# Patient Record
Sex: Male | Born: 1955 | Race: White | Hispanic: No | Smoking: Former smoker
Health system: Southern US, Community
[De-identification: ages and names within clinical notes are randomized; demographics above are authoritative.]

## PROBLEM LIST (undated history)

## (undated) DIAGNOSIS — Z9889 Other specified postprocedural states: Secondary | ICD-10-CM

## (undated) DIAGNOSIS — Z9289 Personal history of other medical treatment: Secondary | ICD-10-CM

## (undated) DIAGNOSIS — Z7901 Long term (current) use of anticoagulants: Secondary | ICD-10-CM

## (undated) DIAGNOSIS — R9430 Abnormal result of cardiovascular function study, unspecified: Secondary | ICD-10-CM

## (undated) DIAGNOSIS — I4891 Unspecified atrial fibrillation: Secondary | ICD-10-CM

## (undated) HISTORY — DX: Long term (current) use of anticoagulants: Z79.01

## (undated) HISTORY — DX: Other specified postprocedural states: Z98.890

## (undated) HISTORY — DX: Abnormal result of cardiovascular function study, unspecified: R94.30

## (undated) HISTORY — DX: Personal history of other medical treatment: Z92.89

## (undated) HISTORY — DX: Unspecified atrial fibrillation: I48.91

## (undated) HISTORY — PX: OTHER SURGICAL HISTORY: SHX169

---

## 2008-12-02 ENCOUNTER — Ambulatory Visit: Payer: Self-pay | Admitting: Gastroenterology

## 2009-01-27 ENCOUNTER — Encounter (INDEPENDENT_AMBULATORY_CARE_PROVIDER_SITE_OTHER): Payer: Self-pay | Admitting: Interventional Radiology

## 2009-01-27 ENCOUNTER — Ambulatory Visit (HOSPITAL_COMMUNITY): Admission: RE | Admit: 2009-01-27 | Discharge: 2009-01-27 | Payer: Self-pay | Admitting: Gastroenterology

## 2010-04-25 IMAGING — US US BIOPSY
1 series · 7 of 7 positions shown · non-contrast
Comparison: none

Clinical: Chronic hepatitis C

ULTRASOUND-GUIDED RANDOM CORE LIVER BIOPSY:
An ultrasound guided liver biopsy was thoroughly discussed with the
patient and questions were answered. The benefits, risks,
alternatives, and complications were also discussed. The patient
understands and wishes to proceed with the procedure. A verbal as
well as written consent was obtained.
Ultrasound imaging of the liver was performed and an appropriate
skin entry site was determined. Skin site was marked, prepped and
draped in the usual sterile fashion. 1% Lidocaine was infiltrated
locally. A 17 gauge Trocar needle was advanced under ultrasound
guidance into the liver. Imaging was obtained documenting
appropriate needle position.  4 coaxial 18 gauge core samples were
then obtained and sent to the laboratory for further analysis. Post
procedure scans demonstrate no evidence of bleeding or hematoma.
The patient tolerated the procedure well with no immediate
complication.
Cardiac and respiratory monitoring was provided by the radiology
RN.  Moderate sedation in the form of 75 mcg fentanyl and 1.5 mg of
versed were administered throughout the procedure for a total of 10
minutes.

[Series 1: us biopsy · 0.32mm/px · 7 of 7 slices shown]
[im 1/7]
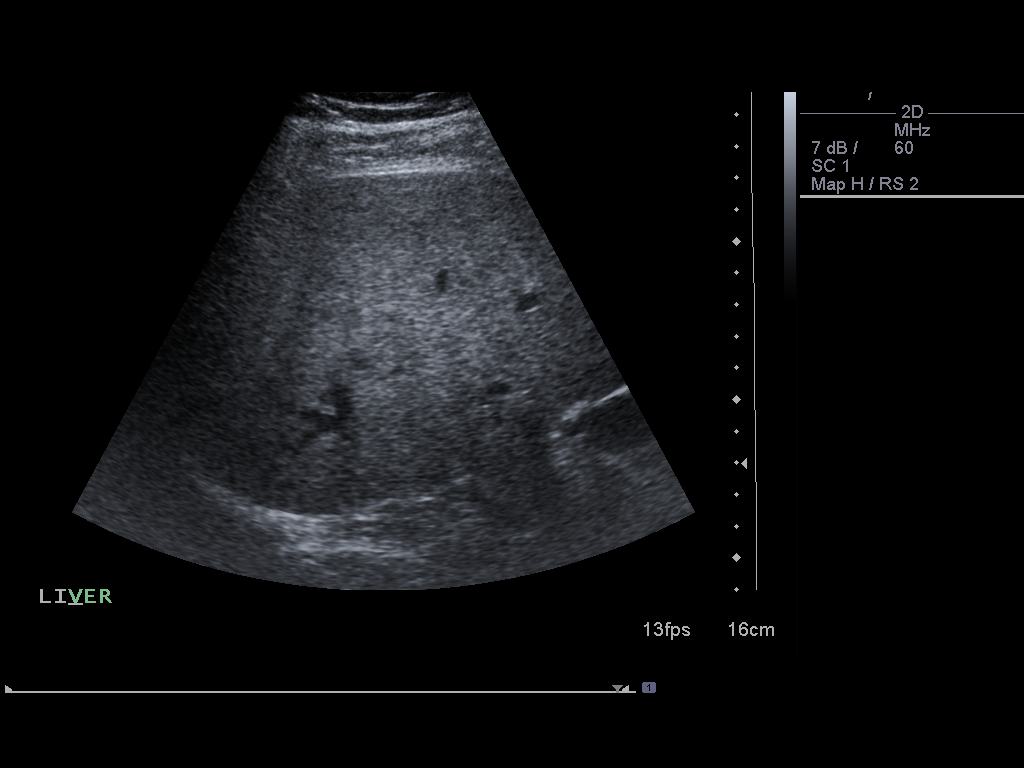
[im 2/7]
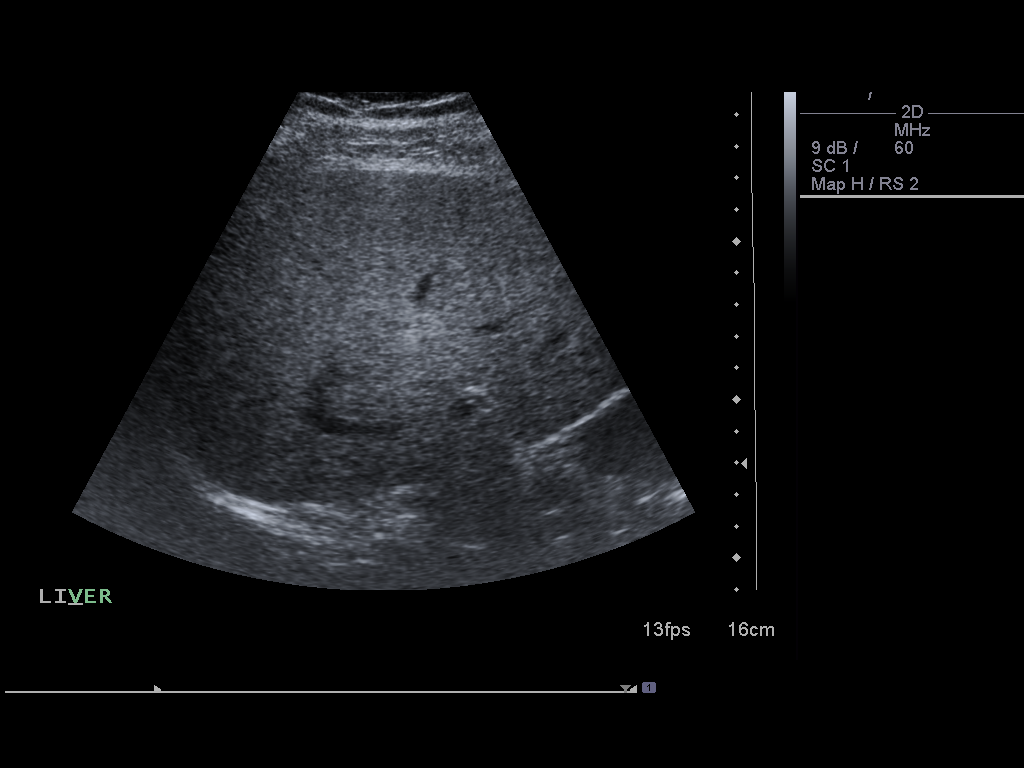
[im 3/7]
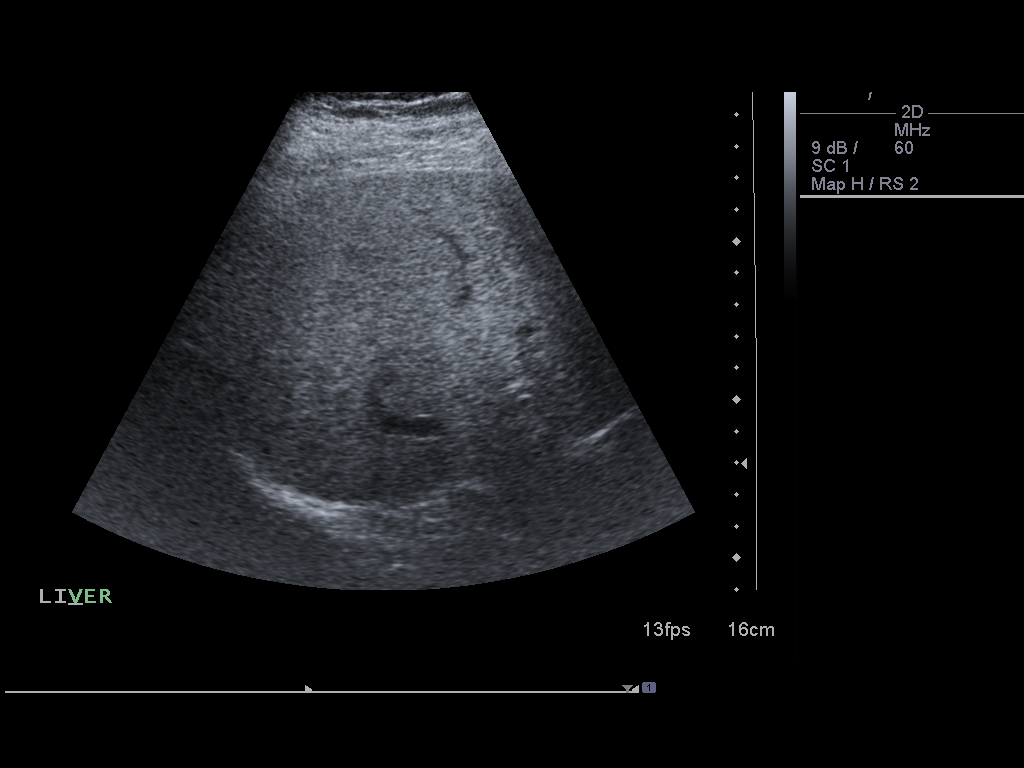
[im 4/7]
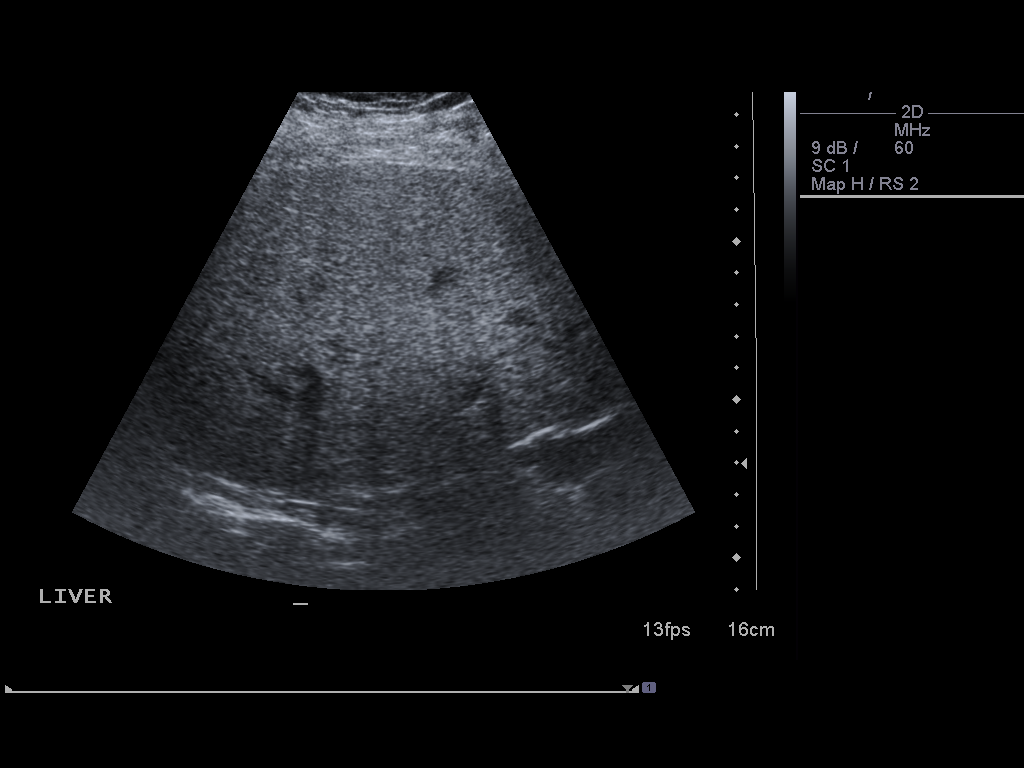
[im 5/7]
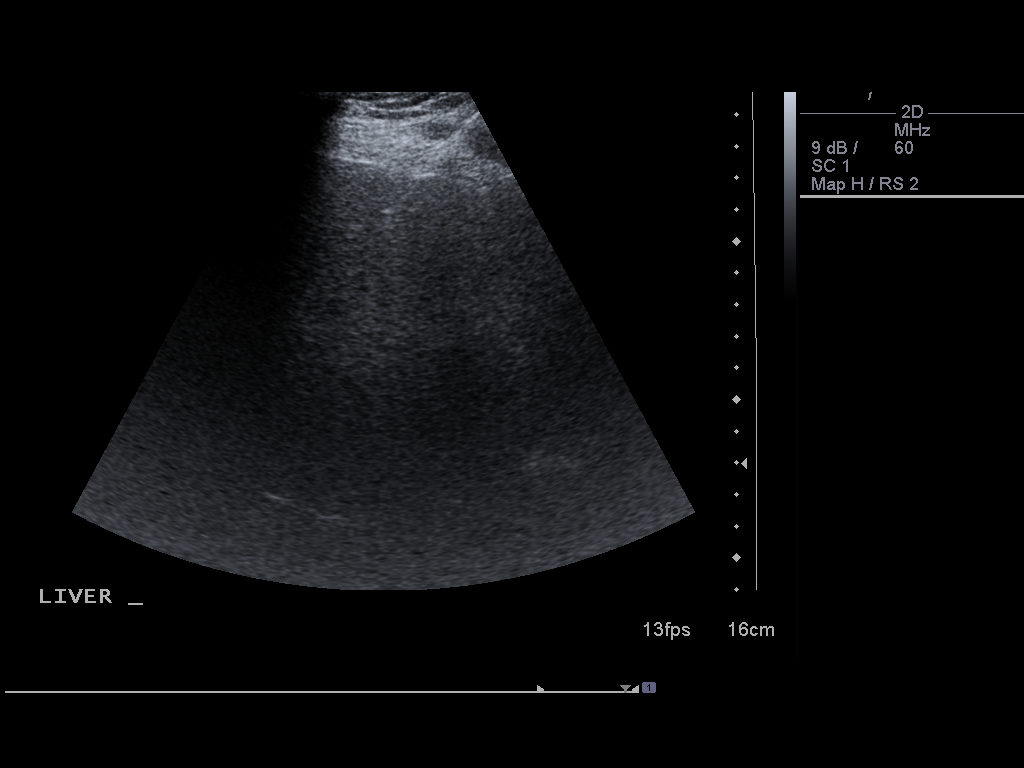
[im 6/7]
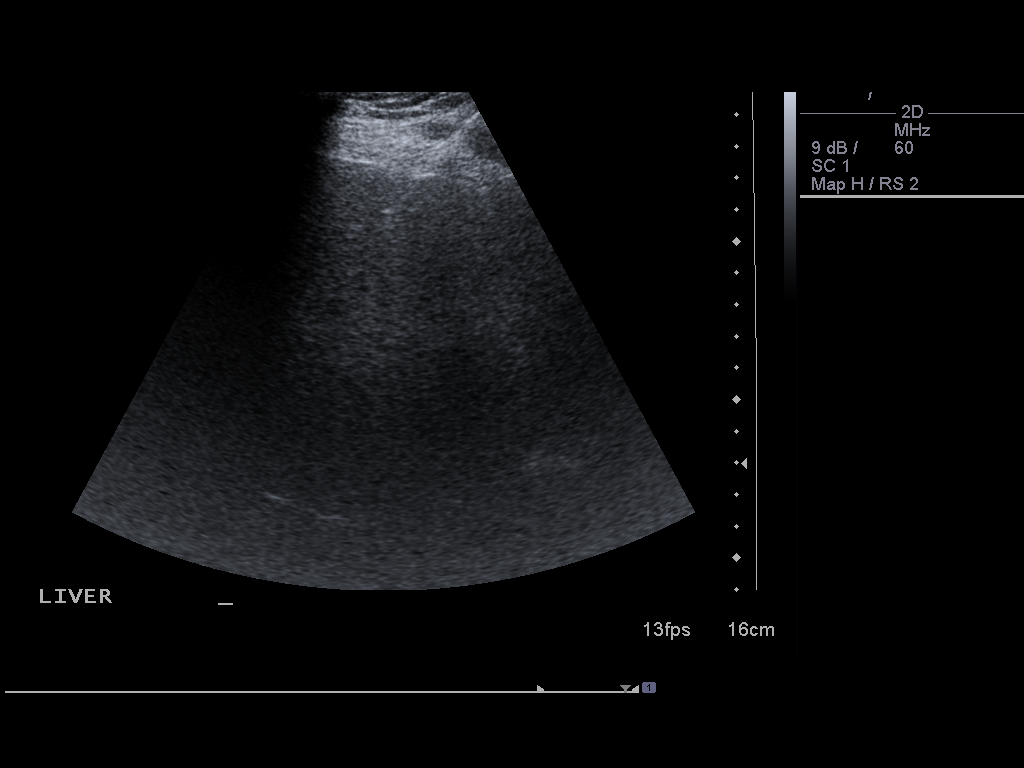
[im 7/7]
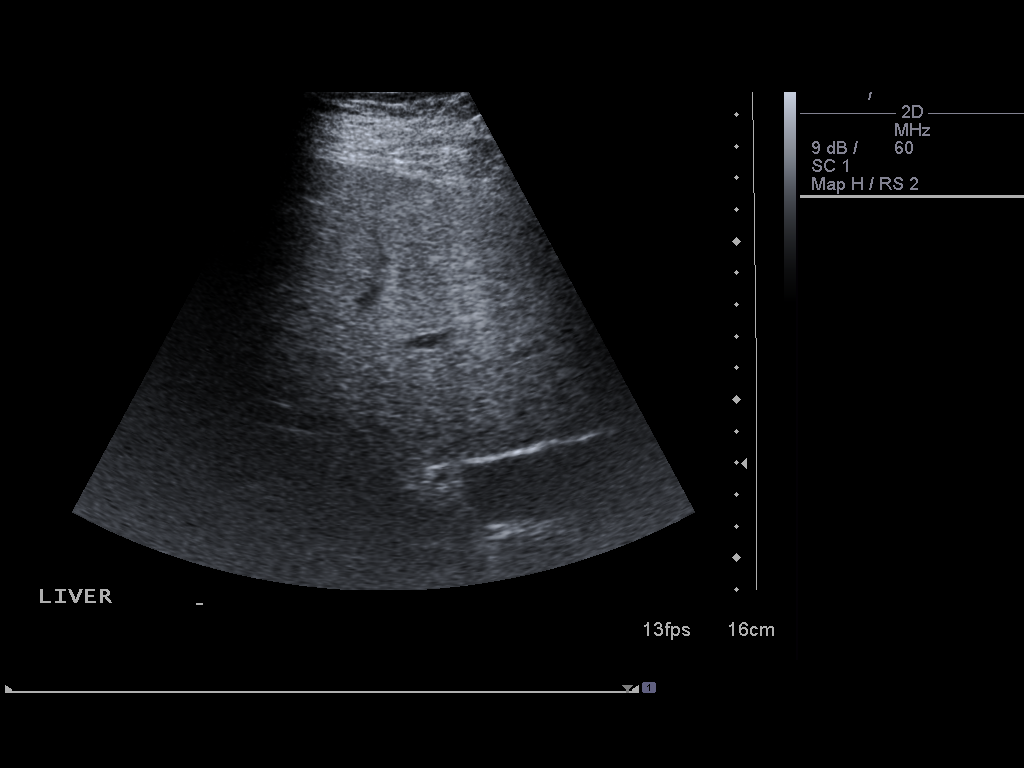

[7 of 7 positions shown; findings below may reference images not displayed]

IMPRESSION: 1. Technically successful ultrasound guided random liver core
biopsy with moderate sedation as described above.

Read by: Chagolla, Farzana.-CHATHA

## 2011-01-17 LAB — CBC
HCT: 47.6 % (ref 39.0–52.0)
Hemoglobin: 16.7 g/dL (ref 13.0–17.0)
RBC: 5 MIL/uL (ref 4.22–5.81)
WBC: 7.7 10*3/uL (ref 4.0–10.5)

## 2018-10-22 DIAGNOSIS — I161 Hypertensive emergency: Secondary | ICD-10-CM | POA: Insufficient documentation

## 2018-10-26 DIAGNOSIS — I5043 Acute on chronic combined systolic (congestive) and diastolic (congestive) heart failure: Secondary | ICD-10-CM

## 2018-10-26 DIAGNOSIS — I5022 Chronic systolic (congestive) heart failure: Secondary | ICD-10-CM

## 2018-10-26 HISTORY — DX: Acute on chronic combined systolic (congestive) and diastolic (congestive) heart failure: I50.43

## 2018-10-28 DIAGNOSIS — I10 Essential (primary) hypertension: Secondary | ICD-10-CM

## 2018-10-28 HISTORY — DX: Essential (primary) hypertension: I10

## 2018-12-30 DIAGNOSIS — I4892 Unspecified atrial flutter: Secondary | ICD-10-CM

## 2018-12-30 DIAGNOSIS — I4891 Unspecified atrial fibrillation: Secondary | ICD-10-CM

## 2018-12-30 DIAGNOSIS — I5022 Chronic systolic (congestive) heart failure: Secondary | ICD-10-CM

## 2018-12-30 DIAGNOSIS — I5042 Chronic combined systolic (congestive) and diastolic (congestive) heart failure: Secondary | ICD-10-CM

## 2018-12-30 DIAGNOSIS — Z8739 Personal history of other diseases of the musculoskeletal system and connective tissue: Secondary | ICD-10-CM

## 2018-12-30 DIAGNOSIS — I10 Essential (primary) hypertension: Secondary | ICD-10-CM

## 2018-12-31 DIAGNOSIS — I351 Nonrheumatic aortic (valve) insufficiency: Secondary | ICD-10-CM

## 2018-12-31 DIAGNOSIS — I340 Nonrheumatic mitral (valve) insufficiency: Secondary | ICD-10-CM

## 2019-01-05 ENCOUNTER — Telehealth: Payer: Self-pay | Admitting: Emergency Medicine

## 2019-01-05 DIAGNOSIS — I4891 Unspecified atrial fibrillation: Secondary | ICD-10-CM

## 2019-01-05 DIAGNOSIS — I5043 Acute on chronic combined systolic (congestive) and diastolic (congestive) heart failure: Secondary | ICD-10-CM | POA: Insufficient documentation

## 2019-01-05 HISTORY — DX: Unspecified atrial fibrillation: I48.91

## 2019-01-05 NOTE — Telephone Encounter (Signed)
    _____________   COVID-19 Pre-Screening Questions:  . Do you currently have a fever? No  (yes = cancel and refer to pcp for e-visit) . Have you recently travelled on a cruise, internationally, or to [place], [place], [place], [place], [place], or [place], [place] [employer]) ? No (yes = cancel, stay home, monitor symptoms, and contact pcp or initiate e-visit if symptoms develop) . Have you been in contact with someone that is currently pending confirmation of Covid19 testing or has been confirmed to have the Covid19 virus?  no (yes = cancel, stay home, away from tested individual, monitor symptoms, and contact pcp or initiate e-visit if symptoms develop) . Are you currently experiencing fatigue or cough? NO  (yes = pt should be prepared to have a mask placed at the time of their visit).    Confirmed appointment for patient tomorrow at 11 am with Dr. Bing Matter.

## 2019-01-06 ENCOUNTER — Telehealth (INDEPENDENT_AMBULATORY_CARE_PROVIDER_SITE_OTHER): Payer: Self-pay | Admitting: Cardiology

## 2019-01-06 ENCOUNTER — Encounter: Payer: Self-pay | Admitting: Cardiology

## 2019-01-06 ENCOUNTER — Telehealth: Payer: Self-pay

## 2019-01-06 ENCOUNTER — Other Ambulatory Visit: Payer: Self-pay

## 2019-01-06 VITALS — BP 129/100 | HR 92 | Ht 70.0 in | Wt 221.0 lb

## 2019-01-06 DIAGNOSIS — I4892 Unspecified atrial flutter: Secondary | ICD-10-CM

## 2019-01-06 DIAGNOSIS — I5043 Acute on chronic combined systolic (congestive) and diastolic (congestive) heart failure: Secondary | ICD-10-CM

## 2019-01-06 DIAGNOSIS — I110 Hypertensive heart disease with heart failure: Secondary | ICD-10-CM

## 2019-01-06 DIAGNOSIS — I10 Essential (primary) hypertension: Secondary | ICD-10-CM

## 2019-01-06 HISTORY — DX: Unspecified atrial flutter: I48.92

## 2019-01-06 NOTE — Telephone Encounter (Signed)
Left voicemail to have patient confirm if he is okay with changing his office visit to a web visit or telephone visit.

## 2019-01-06 NOTE — Addendum Note (Signed)
Addended by: Vanessa Pennington R on: 01/06/2019 12:12 PM   Modules accepted: Orders

## 2019-01-06 NOTE — Progress Notes (Signed)
Virtual Visit via Video Note    Evaluation Performed:  Follow-up visit  This visit type was conducted due to national recommendations for restrictions regarding the COVID-19 Pandemic (e.g. social distancing).  This format is felt to be most appropriate for this patient at this time.  All issues noted in this document were discussed and addressed.  No physical exam was performed (except for noted visual exam findings with Video Visits).  Please refer to the patient's chart (MyChart message for video visits and phone note for telephone visits) for the patient's consent to telehealth for [facility].  Date:  01/06/2019  ID: David Braun, DOB 08-Oct-1956, MRN [id number]   Patient Location:  [address] [place] [address]   Provider location:   [facility] [place] [facility]  PCP:  Eloisa Northern, MD  Cardiologist:  Gypsy Balsam, MD     Chief Complaint: I am being recently in the hospital  History of Present Illness:    David Braun is a 63 y.o. male  who presents via audio/video conferencing for a telehealth visit today.  Recently he went for annual physical to his primary care physician he was noted to be tachycardic he was sent to the emergency room he was identified to be Nitrol flutter with fast ventricular rate.  Echocardiogram was also done which showed ejection fraction 35 to 40%.  He was put on Cardizem as well as beta-blocker and then spontaneously converted to sinus rhythm.  He was also anticoagulated which is very appropriate.  His chads 2 Vascor equals 2 for hypertension as well as for congestive heart failure.  We do tele-visit today after being discharged from the hospital.  Overall he said he feels fine he never felt bad even when he got atrial flutter still described to have some rare skipped beats but no sustained arrhythmia.  He does check blood pressure on the regular basis blood pressure seems to be under control however he noted that sometimes his heart rate is  elevated.  He does not feel it.  He try to get Eliquis in the pharmacy however price was excessive he was not able to afford it.  Luckily we gave him a coupon for 1 month of Eliquis and will work on getting him enrolled to our program so we will be able to help him that way.  Overall denies having any chest pain tightness squeezing pressure burning the chest doing well. He does have past medical history significant for hypertension In January he ended up in our hospital because of shortness of breath he was identified to have cardiomyopathy at that time.  Visit in the emergency room because of high blood pressure previously.   The patient does not symptoms concerning for COVID-19 infection (fever, chills, cough, or new SHORTNESS OF BREATH).    Prior CV studies:   The following studies were reviewed today:  I reviewed old study from the hospital.  His ejection fraction echocardiogram was 35 to 40%.  Chest x-ray was unrevealing.     No past medical history on file.     Current Meds  Medication Sig  . apixaban (ELIQUIS) 5 MG TABS tablet Take 5 mg by mouth 2 (two) times daily.  Marland Kitchen aspirin EC 81 MG tablet Take 1 tablet by mouth daily.  Marland Kitchen CARTIA XT 120 MG 24 hr capsule Take 120 mg by mouth daily.  . carvedilol (COREG) 25 MG tablet Take 25 mg by mouth 2 (two) times daily.  . furosemide (LASIX)  20 MG tablet Take 20 mg by mouth daily.      Family History: The patient's family history includes Diabetes in his mother; Hypertension in his father and mother.   ROS:   Please see the history of present illness.     All other systems reviewed and are negative.   Labs/Other Tests and Data Reviewed:     Recent Labs: No results found for requested labs within last 8760 hours.  Recent Lipid Panel No results found for: CHOL, TRIG, HDL, CHOLHDL, VLDL, LDLCALC, LDLDIRECT    Exam:    Vital Signs:  Wt 141 lb (64 kg)   BMI 24.20 kg/m    Well nourished, well developed male in no acute  distress. We conducted this visit through the video link with WebEx.  He is alert awake oriented x3 not in any distress there is no JVD there is no swelling of lactamases his mood was appropriate.  Diagnosis for this visit:   1. CHF (congestive heart failure), NYHA class III, acute on chronic, combined (HCC)   2. Paroxysmal atrial flutter (HCC)   3. Uncontrolled hypertension      ASSESSMENT & PLAN:    1.  Congestive heart failure with ejection fraction of 35 to 40% he appears to be compensated he is on beta-blocker carvedilol 25 twice daily with advised him to continue.  For some reason he is not on lisinopril.  I will bring him to the office next week we will do Chem-7 as well as EKG and based on that decide about future therapy.  I hope to be able to put him on ACE inhibitor.  EKG also help me to determine what rhythm he is in we may be forced to discontinue his calcium channel blocker. 2.  Paroxysmal atrial flutter: We will continue with anticoagulation.  No antiarrhythmic yet.  We will see how his rhythm will behave. 3.  Uncontrolled hypertension.  His blood pressure is better controlled.  To present management.  Review records from the hospital for that visit  COVID-19 Education: The signs and symptoms of COVID-19 were discussed with the patient and how to seek care for testing (follow up with PCP or arrange E-visit).  The importance of social distancing was discussed today.  Patient Risk:   After full review of this patients clinical status, I feel that they are at least moderate risk at this time.  Time:   Today, I have spent 25 minutes with the patient with telehealth technology discussing pt health issues.  We finished visit at 11:25 AM    Medication Adjustments/Labs and Tests Ordered: Current medicines are reviewed at length with the patient today.  Concerns regarding medicines are outlined above.  No orders of the defined types were placed in this encounter.  Medication  changes: No orders of the defined types were placed in this encounter.    Disposition: He is supposed to come to our office on Monday to have EKG done as well as Chem-7.  Enroll him in our program for Eliquis  Signed, Georgeanna Lea, MD, [facility] 01/06/2019 11:26 AM    [facility]

## 2019-01-06 NOTE — Patient Instructions (Signed)
Medication Instructions:  .isntcur  If you need a refill on your cardiac medications before your next appointment, please call your pharmacy.   Lab work: Your physician recommends that you return for lab work next week: bmp   If you have labs (blood work) drawn today and your tests are completely normal, you will receive your results only by: Marland Kitchen MyChart Message (if you have MyChart) OR . A paper copy in the mail If you have any lab test that is abnormal or we need to change your treatment, we will call you to review the results.  Testing/Procedures: None.  Follow-Up: At [facility], you and your health needs are our priority.  As part of our continuing mission to provide you with exceptional heart care, we have created designated Provider Care Teams.  These Care Teams include your primary Cardiologist (physician) and Advanced Practice Providers (APPs -  Physician Assistants and Nurse Practitioners) who all work together to provide you with the care you need, when you need it. You will need a follow up appointment in 1 weeks.  Please call our office 2 months in advance to schedule this appointment.  You may see No primary care provider on file. or another member of our [employer] Provider Team in [place]: David Herrlich, MD . David Crome, MD  Any Other Special Instructions Will Be Listed Below (If Applicable).

## 2019-01-12 ENCOUNTER — Ambulatory Visit (INDEPENDENT_AMBULATORY_CARE_PROVIDER_SITE_OTHER): Payer: Self-pay | Admitting: Cardiology

## 2019-01-12 ENCOUNTER — Encounter: Payer: Self-pay | Admitting: Cardiology

## 2019-01-12 ENCOUNTER — Telehealth: Payer: Self-pay | Admitting: Emergency Medicine

## 2019-01-12 ENCOUNTER — Other Ambulatory Visit: Payer: Self-pay

## 2019-01-12 VITALS — BP 128/64 | HR 140 | Ht 70.0 in | Wt 232.2 lb

## 2019-01-12 DIAGNOSIS — I429 Cardiomyopathy, unspecified: Secondary | ICD-10-CM

## 2019-01-12 DIAGNOSIS — I4892 Unspecified atrial flutter: Secondary | ICD-10-CM

## 2019-01-12 DIAGNOSIS — I5043 Acute on chronic combined systolic (congestive) and diastolic (congestive) heart failure: Secondary | ICD-10-CM

## 2019-01-12 DIAGNOSIS — I10 Essential (primary) hypertension: Secondary | ICD-10-CM

## 2019-01-12 HISTORY — DX: Cardiomyopathy, unspecified: I42.9

## 2019-01-12 LAB — BASIC METABOLIC PANEL
BUN/Creatinine Ratio: 13 (ref 10–24)
BUN: 17 mg/dL (ref 8–27)
CALCIUM: 9.3 mg/dL (ref 8.6–10.2)
CHLORIDE: 101 mmol/L (ref 96–106)
CO2: 22 mmol/L (ref 20–29)
CREATININE: 1.3 mg/dL — AB (ref 0.76–1.27)
GFR calc non Af Amer: 59 mL/min/{1.73_m2} — ABNORMAL LOW (ref >59)
GFR, EST AFRICAN AMERICAN: 68 mL/min/{1.73_m2} (ref >59)
Glucose: 126 mg/dL — ABNORMAL HIGH (ref 65–99)
Potassium: 4.3 mmol/L (ref 3.5–5.2)
Sodium: 142 mmol/L (ref 134–144)

## 2019-01-12 MED ORDER — AMIODARONE HCL 200 MG PO TABS: 200.0000 mg | ORAL_TABLET | Freq: Every day | ORAL | 1 refills | Status: DC

## 2019-01-12 NOTE — Patient Instructions (Signed)
Medication Instructions:  Your physician has recommended you make the following change in your medication:  STOP: Cartia   START: Amiodarone 200 mg daily  If you need a refill on your cardiac medications before your next appointment, please call your pharmacy.   Lab work: Your physician recommends that you return for lab work today: bmp   If you have labs (blood work) drawn today and your tests are completely normal, you will receive your results only by: David Braun MyChart Message (if you have MyChart) OR . A paper copy in the mail If you have any lab test that is abnormal or we need to change your treatment, we will call you to review the results.  Testing/Procedures: None.   Follow-Up: At [facility], you and your health needs are our priority.  As part of our continuing mission to provide you with exceptional heart care, we have created designated Provider Care Teams.  These Care Teams include your primary Cardiologist (physician) and Advanced Practice Providers (APPs -  Physician Assistants and Nurse Practitioners) who all work together to provide you with the care you need, when you need it. You will need a follow up appointment in 2 weeks.  Please call our office 2 months in advance to schedule this appointment.  You may see No primary care provider on file.  or another member of our [employer] Provider Team in [place]: Norman Herrlich, MD . Belva Crome, MD  Any Other Special Instructions Will Be Listed Below (If Applicable).  Amiodarone tablets What is this medicine? AMIODARONE (a MEE oh da rone) is an antiarrhythmic drug. It helps make your heart beat regularly. Because of the side effects caused by this medicine, it is only used when other medicines have not worked. It is usually used for heartbeat problems that may be life threatening. This medicine may be used for other purposes; ask your health care provider or pharmacist if you have questions. COMMON BRAND NAME(S):  Cordarone, Pacerone What should I tell my health care provider before I take this medicine? They need to know if you have any of these conditions: -liver disease -lung disease -other heart problems -thyroid disease -an unusual or allergic reaction to amiodarone, iodine, other medicines, foods, dyes, or preservatives -pregnant or trying to get pregnant -breast-feeding How should I use this medicine? Take this medicine by mouth with a glass of water. Follow the directions on the prescription label. You can take this medicine with or without food. However, you should always take it the same way each time. Take your doses at regular intervals. Do not take your medicine more often than directed. Do not stop taking except on the advice of your doctor or health care professional. A special MedGuide will be given to you by the pharmacist with each prescription and refill. Be sure to read this information carefully each time. Talk to your pediatrician regarding the use of this medicine in children. Special care may be needed. Overdosage: If you think you have taken too much of this medicine contact a poison control center or emergency room at once. NOTE: This medicine is only for you. Do not share this medicine with others. What if I miss a dose? If you miss a dose, take it as soon as you can. If it is almost time for your next dose, take only that dose. Do not take double or extra doses. What may interact with this medicine? Do not take this medicine with any of the following medications: -abarelix -apomorphine -  arsenic trioxide -certain antibiotics like erythromycin, gemifloxacin, levofloxacin, pentamidine -certain medicines for depression like amoxapine, tricyclic antidepressants -certain medicines for fungal infections like fluconazole, itraconazole, ketoconazole, posaconazole, voriconazole -certain medicines for irregular heart beat like disopyramide, dofetilide, dronedarone, ibutilide,  propafenone, sotalol -certain medicines for malaria like chloroquine, halofantrine -cisapride -droperidol -haloperidol -hawthorn -maprotiline -methadone -phenothiazines like chlorpromazine, mesoridazine, thioridazine -pimozide -ranolazine -red yeast rice -vardenafil -ziprasidone This medicine may also interact with the following medications: -antiviral medicines for HIV or AIDS -certain medicines for blood pressure, heart disease, irregular heart beat -certain medicines for cholesterol like atorvastatin, cerivastatin, lovastatin, simvastatin -certain medicines for hepatitis C like sofosbuvir and ledipasvir; sofosbuvir -certain medicines for seizures like phenytoin -certain medicines for thyroid problems -certain medicines that treat or prevent blood clots like warfarin -cholestyramine -cimetidine -clopidogrel -cyclosporine -dextromethorphan -diuretics -fentanyl -general anesthetics -grapefruit juice -lidocaine -loratadine -methotrexate -other medicines that prolong the QT interval (cause an abnormal heart rhythm) -procainamide -quinidine -rifabutin, rifampin, or rifapentine -St. John's Wort -trazodone This list may not describe all possible interactions. Give your health care provider a list of all the medicines, herbs, non-prescription drugs, or dietary supplements you use. Also tell them if you smoke, drink alcohol, or use illegal drugs. Some items may interact with your medicine. What should I watch for while using this medicine? Your condition will be monitored closely when you first begin therapy. Often, this drug is first started in a hospital or other monitored health care setting. Once you are on maintenance therapy, visit your doctor or health care professional for regular checks on your progress. Because your condition and use of this medicine carry some risk, it is a good idea to carry an identification card, necklace or bracelet with details of your condition,  medications, and doctor or health care professional. Bonita Quin may get drowsy or dizzy. Do not drive, use machinery, or do anything that needs mental alertness until you know how this medicine affects you. Do not stand or sit up quickly, especially if you are an older patient. This reduces the risk of dizzy or fainting spells. This medicine can make you more sensitive to the sun. Keep out of the sun. If you cannot avoid being in the sun, wear protective clothing and use sunscreen. Do not use sun lamps or tanning beds/booths. You should have regular eye exams before and during treatment. Call your doctor if you have blurred vision, see halos, or your eyes become sensitive to light. Your eyes may get dry. It may be helpful to use a lubricating eye solution or artificial tears solution. If you are going to have surgery or a procedure that requires contrast dyes, tell your doctor or health care professional that you are taking this medicine. What side effects may I notice from receiving this medicine? Side effects that you should report to your doctor or health care professional as soon as possible: -allergic reactions like skin rash, itching or hives, swelling of the face, lips, or tongue -blue-gray coloring of the skin -blurred vision, seeing blue green halos, increased sensitivity of the eyes to light -breathing problems -chest pain -dark urine -fast, irregular heartbeat -feeling faint or light-headed -intolerance to heat or cold -nausea or vomiting -pain and swelling of the scrotum -pain, tingling, numbness in feet, hands -redness, blistering, peeling or loosening of the skin, including inside the mouth -spitting up blood -stomach pain -sweating -unusual or uncontrolled movements of body -unusually weak or tired -weight gain or loss -yellowing of the eyes or skin Side effects that usually do  not require medical attention (report to your doctor or health care professional if they continue or are  bothersome): -change in sex drive or performance -constipation -dizziness -headache -loss of appetite -trouble sleeping This list may not describe all possible side effects. Call your doctor for medical advice about side effects. You may report side effects to FDA at 1-800-FDA-1088. Where should I keep my medicine? Keep out of the reach of children. Store at room temperature between 20 and 25 degrees C (68 and 77 degrees F). Protect from light. Keep container tightly closed. Throw away any unused medicine after the expiration date. NOTE: This sheet is a summary. It may not cover all possible information. If you have questions about this medicine, talk to your doctor, pharmacist, or health care provider.  2019 Elsevier/Gold Standard (2013-12-28 19:48:11)

## 2019-01-12 NOTE — Progress Notes (Signed)
Cardiology Office Note:    Date:  01/12/2019   ID:  [place], DOB 10-10-1955, MRN [id number]  PCP:  Eloisa Northern, MD  Cardiologist:  Gypsy Balsam, MD    Referring MD: Eloisa Northern, MD   No chief complaint on file. I have diabetes  History of Present Illness:    [place] is a 63 y.o. male who was recently in the round of hospital.  He was find to be in atrial flutter with fast ventricular rate echocardiogram was done which showed diminished left ventricular ejection fraction.  Anticoagulation has been initiated and then he converted spontaneously to sinus rhythm.  Since that time he described palpitations on and off for very he came to our office to have EKG done and showed an have he is in atrial flutter with 2-1 AV conduction with ventricular response of 140.  He is asymptomatic other than feeling palpitations.  He does not have shortness of breath he does not have chest pain he is not dizzy.  I told him that in this situation hospitalization may be required however he does not want to go back to hospital.  He said he is doing well.  Therefore, I will stop his Cardizem.  I will put him on amiodarone 200 mg twice daily.  We will be in touch with him over the phone on a daily basis to see how he does.  I also warned him if he developed shortness of breath chest tightness dizziness or simply does not feel well he needs to go to the emergency room.  He is anticoagulated and I will continue.  We initiated also conversation about potentially doing atrial flutter ablation which may be the best option for him.  Because of coronavirus situation I preferred conservative approach initially but if we fail with amiodarone atrial flutter ablation may be desirable.  No past medical history on file.    Current Medications: Current Meds  Medication Sig  . allopurinol (ZYLOPRIM) 300 MG tablet Take 1 tablet by mouth daily.  Marland Kitchen apixaban (ELIQUIS) 5 MG TABS tablet Take 5 mg by mouth 2 (two) times daily.   Marland Kitchen aspirin EC 81 MG tablet Take 1 tablet by mouth daily.  Marland Kitchen CARTIA XT 120 MG 24 hr capsule Take 120 mg by mouth daily.  . carvedilol (COREG) 25 MG tablet Take 25 mg by mouth 2 (two) times daily.  . furosemide (LASIX) 20 MG tablet Take 20 mg by mouth daily.     Allergies:   Patient has no known allergies.   Social History   Socioeconomic History  . Marital status: Single    Spouse name: Not on file  . Number of children: Not on file  . Years of education: Not on file  . Highest education level: Not on file  Occupational History  . Not on file  Social Needs  . Financial resource strain: Not on file  . Food insecurity:    Worry: Not on file    Inability: Not on file  . Transportation needs:    Medical: Not on file    Non-medical: Not on file  Tobacco Use  . Smoking status: Former Games developer  . Smokeless tobacco: Never Used  Substance and Sexual Activity  . Alcohol use: Yes  . Drug use: Not Currently    Types: Marijuana  . Sexual activity: Not on file  Lifestyle  . Physical activity:    Days per week: Not on file    Minutes per session: Not on  file  . Stress: Not on file  Relationships  . Social connections:    Talks on phone: Not on file    Gets together: Not on file    Attends religious service: Not on file    Active member of club or organization: Not on file    Attends meetings of clubs or organizations: Not on file    Relationship status: Not on file  Other Topics Concern  . Not on file  Social History Narrative  . Not on file     Family History: The patient's family history includes Diabetes in his mother; Hypertension in his father and mother. ROS:   Please see the history of present illness.    All 14 point review of systems negative except as described per history of present illness  EKGs/Labs/Other Studies Reviewed:    EKG done today show atrial flutter with 2-1 AV conduction.  Normal QS complex duration morphology  Recent Labs: No results found for  requested labs within last 8760 hours.  Recent Lipid Panel No results found for: CHOL, TRIG, HDL, CHOLHDL, VLDL, LDLCALC, LDLDIRECT  Physical Exam:    VS:  BP 128/64   Pulse (!) 140   Ht 5\' 10"  (1.778 m)   Wt 232 lb 3.2 oz (105.3 kg)   SpO2 98%   BMI 33.32 kg/m     Wt Readings from Last 3 Encounters:  01/12/19 232 lb 3.2 oz (105.3 kg)  01/06/19 221 lb (100.2 kg)     GEN:  Well nourished, well developed in no acute distress HEENT: Normal NECK: No JVD; No carotid bruits LYMPHATICS: No lymphadenopathy CARDIAC: RRR, tachycardic., no murmurs, no rubs, no gallops RESPIRATORY:  Clear to auscultation without rales, wheezing or rhonchi  ABDOMEN: Soft, non-tender, non-distended MUSCULOSKELETAL:  No edema; No deformity  SKIN: Warm and dry LOWER EXTREMITIES: no swelling NEUROLOGIC:  Alert and oriented x 3 PSYCHIATRIC:  Normal affect   ASSESSMENT:    1. Paroxysmal atrial flutter (HCC)   2. CHF (congestive heart failure), NYHA class III, acute on chronic, combined (HCC)   3. Essential hypertension    PLAN:    In order of problems listed above:  1. Paroxysmal atrial flutter with poorly controlled ventricular rate.  Again I offered him hospitalization he prefers not to do it we will try to manage this as an outpatient.  Likely he is asymptomatic except for palpitations.  I will start amiodarone 200 mg twice daily.  In the future he may require ablation.  We will continue anticoagulation. 2. Congestive heart failure.  Appears to be compensated no JVD no swelling of lower extremities no proximal nocturnal dyspnea.  We will continue present management.  However will be in touch over the phone every day to make sure he is doing well. 3. Essential hypertension blood pressure well controlled continue present management. 4. Cardiomyopathy with ejection fraction 35 to 40% he is already on high dose of beta-blocker which I will continue.  Amiodarone will be added to control better his atrial  flutter, I will do Chem-7 today if Chem-7 is acceptable will initiate lisinopril or ARB.   Medication Adjustments/Labs and Tests Ordered: Current medicines are reviewed at length with the patient today.  Concerns regarding medicines are outlined above.  No orders of the defined types were placed in this encounter.  Medication changes: No orders of the defined types were placed in this encounter.   Signed, Georgeanna Lea, MD, [facility] 01/12/2019 9:04 AM    [facility]

## 2019-01-12 NOTE — Telephone Encounter (Signed)
Patient denies any covid 19 symptoms and will be coming to office as planned.

## 2019-01-12 NOTE — Telephone Encounter (Signed)
   Primary Cardiologist: No primary care provider on file.   Pt contacted.  History and symptoms reviewed.  Pt will f/u with [facility] provider as scheduled.  Pt. advised that we are restricting visitors at this time and request that only patients present for check-in prior to their appointment.  All other visitors should remain in their car.  If necessary, only one visitor may come with the patient, into the building. For everyone's safety, all patients and visitor entering our practice area should expect to be screened again prior to entering our waiting area.  Lita Mains, RN  01/12/2019 8:23 AM

## 2019-01-19 ENCOUNTER — Telehealth: Payer: Self-pay | Admitting: Emergency Medicine

## 2019-01-19 NOTE — Telephone Encounter (Signed)
Patient scheduled for a televisit on Friday 01/23/2019 per Dr. Bing Matter. Consent sent to my chart.

## 2019-01-20 ENCOUNTER — Telehealth: Payer: Self-pay | Admitting: Cardiology

## 2019-01-20 MED ORDER — CARVEDILOL 25 MG PO TABS: 25.0000 mg | ORAL_TABLET | Freq: Two times a day (BID) | ORAL | 1 refills | Status: DC

## 2019-01-20 MED ORDER — AMIODARONE HCL 200 MG PO TABS: 200.0000 mg | ORAL_TABLET | Freq: Every day | ORAL | 1 refills | Status: DC

## 2019-01-20 NOTE — Telephone Encounter (Signed)
°*  STAT* PATIENT IS COMPLETELY OUT  1. Which medications need to be refilled? (please list name of each medication and dose if known) amiodarone (PACERONE) 200 MG    2. Which pharmacy/location (including street and city if local pharmacy) is medication to be sent to?  [facility] - [place], [place] - [address] [phone] (Phone) [phone] (Fax)     3. Do they need a 30 day or 90 day supply? 90

## 2019-01-20 NOTE — Telephone Encounter (Signed)
°  Patient is out of medication and needs this called in today!   1. Which medications need to be refilled? (please list name of each medication and dose if known) Carvedilol 25mg  twice a day  2. Which pharmacy/location (including street and city if local pharmacy) is medication to be sent to? [facility] on [address] in [place]  3. Do they need a 30 day or 90 day supply? 90

## 2019-01-20 NOTE — Telephone Encounter (Signed)
Coreg refill sent to [facility] on [address] In [place] per pt preference

## 2019-01-23 ENCOUNTER — Other Ambulatory Visit: Payer: Self-pay

## 2019-01-23 ENCOUNTER — Telehealth (INDEPENDENT_AMBULATORY_CARE_PROVIDER_SITE_OTHER): Payer: Self-pay | Admitting: Cardiology

## 2019-01-23 ENCOUNTER — Encounter: Payer: Self-pay | Admitting: Cardiology

## 2019-01-23 VITALS — BP 132/100 | HR 100 | Wt 231.0 lb

## 2019-01-23 DIAGNOSIS — I4892 Unspecified atrial flutter: Secondary | ICD-10-CM

## 2019-01-23 DIAGNOSIS — I10 Essential (primary) hypertension: Secondary | ICD-10-CM

## 2019-01-23 DIAGNOSIS — I5043 Acute on chronic combined systolic (congestive) and diastolic (congestive) heart failure: Secondary | ICD-10-CM

## 2019-01-23 DIAGNOSIS — I420 Dilated cardiomyopathy: Secondary | ICD-10-CM

## 2019-01-23 MED ORDER — AMIODARONE HCL 200 MG PO TABS: 200.0000 mg | ORAL_TABLET | Freq: Three times a day (TID) | ORAL | 1 refills | Status: DC

## 2019-01-23 NOTE — Patient Instructions (Signed)
Medication Instructions:  Your physician has recommended you make the following change in your medication:   Increase: Amiodarone to 200 mg 3 times daily. If you need a refill on your cardiac medications before your next appointment, please call your pharmacy.   Lab work: None.  If you have labs (blood work) drawn today and your tests are completely normal, you will receive your results only by: Marland Kitchen MyChart Message (if you have MyChart) OR . A paper copy in the mail If you have any lab test that is abnormal or we need to change your treatment, we will call you to review the results.  Testing/Procedures: None.   Follow-Up: At [facility], you and your health needs are our priority.  As part of our continuing mission to provide you with exceptional heart care, we have created designated Provider Care Teams.  These Care Teams include your primary Cardiologist (physician) and Advanced Practice Providers (APPs -  Physician Assistants and Nurse Practitioners) who all work together to provide you with the care you need, when you need it. You will need a follow up appointment in 2 weeks.  Please call our office 2 months in advance to schedule this appointment.  You may see No primary care provider on file. or another member of our [employer] Provider Team in [place]: Norman Herrlich, MD . Belva Crome, MD  Any Other Special Instructions Will Be Listed Below (If Applicable).

## 2019-01-23 NOTE — Progress Notes (Signed)
Virtual Visit via Video Note   This visit type was conducted due to national recommendations for restrictions regarding the COVID-19 Pandemic (e.g. social distancing) in an effort to limit this patient's exposure and mitigate transmission in our community.  Due to his co-morbid illnesses, this patient is at least at moderate risk for complications without adequate follow up.  This format is felt to be most appropriate for this patient at this time.  All issues noted in this document were discussed and addressed.  A limited physical exam was performed with this format.  Please refer to the patient's chart for his consent to telehealth for [facility].  Evaluation Performed:  Follow-up visit  This visit type was conducted due to national recommendations for restrictions regarding the COVID-19 Pandemic (e.g. social distancing).  This format is felt to be most appropriate for this patient at this time.  All issues noted in this document were discussed and addressed.  No physical exam was performed (except for noted visual exam findings with Video Visits).  Please refer to the patient's chart (MyChart message for video visits and phone note for telephone visits) for the patient's consent to telehealth for [facility].  Date:  01/23/2019  ID: [name], DOB 1955-11-02, MRN [id number]   Patient Location: [address] [place] [address]   Provider location:   [facility] [place] [facility]  PCP:  Eloisa Northern, MD  Cardiologist:  Gypsy Balsam, MD     Chief Complaint: Doing better  History of Present Illness:    [place] is a 63 y.o. male  who presents via audio/video conferencing for a telehealth visit today.  With persistent atrial flutter.  Actually few weeks ago he ended up going to the hospital because of shortness of breath he was identified to have atrial flutter.  He was given calcium channel blocker to slow his conduction with some improvement then he was seen by me  we started anticoagulation also amiodarone 200 mg twice daily his rate is better controlled he is doing better overall clinically no chest pain tightness squeezing pressure burning chest, no shortness of breath overall he is feeling better.  Denies having any swelling of lower extremities.   The patient does not have symptoms concerning for COVID-19 infection (fever, chills, cough, or new SHORTNESS OF BREATH).    Prior CV studies:   The following studies were reviewed today:       No past medical history on file.     Current Meds  Medication Sig  . allopurinol (ZYLOPRIM) 300 MG tablet Take 1 tablet by mouth daily.  Marland Kitchen amiodarone (PACERONE) 200 MG tablet Take 1 tablet (200 mg total) by mouth daily.  Marland Kitchen apixaban (ELIQUIS) 5 MG TABS tablet Take 5 mg by mouth 2 (two) times daily.  Marland Kitchen aspirin EC 81 MG tablet Take 1 tablet by mouth daily.  . carvedilol (COREG) 25 MG tablet Take 1 tablet (25 mg total) by mouth 2 (two) times daily.  . furosemide (LASIX) 20 MG tablet Take 20 mg by mouth daily.      Family History: The patient's family history includes Diabetes in his mother; Hypertension in his father and mother.   ROS:   Please see the history of present illness.     All other systems reviewed and are negative.   Labs/Other Tests and Data Reviewed:     Recent Labs: 01/12/2019: BUN 17; Creatinine, Ser 1.30; Potassium 4.3; Sodium 142  Recent Lipid Panel No results found  for: CHOL, TRIG, HDL, CHOLHDL, VLDL, LDLCALC, LDLDIRECT    Exam:    Vital Signs:  BP (!) 132/100   Pulse 100   Wt 231 lb (104.8 kg)   BMI 33.15 kg/m     Wt Readings from Last 3 Encounters:  01/23/19 231 lb (104.8 kg)  01/12/19 232 lb 3.2 oz (105.3 kg)  01/06/19 221 lb (100.2 kg)     Well nourished, well developed in no acute distress. Alert awake and at x3.  He is talking to me through the video link from he saw him there is no JVD no swelling of lower extremities he said he feels good  Diagnosis  for this visit:   1. Paroxysmal atrial flutter (HCC)   2. Dilated cardiomyopathy (HCC)   3. Essential hypertension   4. CHF (congestive heart failure), NYHA class III, acute on chronic, combined (HCC)      ASSESSMENT & PLAN:    1.  Persistent atrial flutter.  Gradually increasing dose of amiodarone hoping that he will convert spontaneously.  If not we will attempt to do electrical cardioversion.  He is anticoagulated with Eliquis which I will continue.  Today I will increase dose of amiodarone to 200 g 3 times daily.  I convinced him also that he can benefit from [place] device and he will get it this we will be able to monitor his heart rate and rhythm remotely. 2.  Dilated cardiomyopathy.  Because of his kidney dysfunction was not able to start ARB/ACE inhibitor however I hope that his ejection fraction will improve if we will take care of his tachyarrhythmia. 3.  Essential hypertension blood pressure well controlled continue present management. 4.  Congestive heart failure.  Compensated.   COVID-19 Education: The signs and symptoms of COVID-19 were discussed with the patient and how to seek care for testing (follow up with PCP or arrange E-visit).  The importance of social distancing was discussed today.  Patient Risk:   After full review of this patients clinical status, I feel that they are at least moderate risk at this time.  Time:   Today, I have spent 16 minutes with the patient with telehealth technology discussing pt health issues.  I spent 5 minutes reviewing her chart before the visit.  Visit was finished at 10:37 AM.    Medication Adjustments/Labs and Tests Ordered: Current medicines are reviewed at length with the patient today.  Concerns regarding medicines are outlined above.  No orders of the defined types were placed in this encounter.  Medication changes: No orders of the defined types were placed in this encounter.    Disposition: Follow-up 2 weeks with video  link  Signed, Georgeanna Lea, MD, [facility] 01/23/2019 10:39 AM    [facility]

## 2019-02-02 ENCOUNTER — Ambulatory Visit: Payer: Self-pay | Admitting: Cardiology

## 2019-02-02 ENCOUNTER — Telehealth: Payer: Self-pay | Admitting: *Deleted

## 2019-02-02 NOTE — Telephone Encounter (Signed)
Pt also needs note for work he missed to have visit with Dr. Kirtland Bouchard today. Did not know it had been cancelled.

## 2019-02-02 NOTE — Telephone Encounter (Signed)
Pt has the [place] and reports and not sure where to send them. Pt said he called to talk to someone and never got call back. Please call pt and advise on details of using Kardia.

## 2019-02-02 NOTE — Telephone Encounter (Signed)
[facility] records have been retrieved and given to Dr. Bing Matter for him to review.

## 2019-02-02 NOTE — Telephone Encounter (Signed)
Called patient back. Informed him he can upload kardia readings to mychart and have them sent to Dr. Bing Matter, he verbally understands and will work on this. During the call the patient reports he was supposed to have an appointment today but it got cancelled and he may need a note for work because he had already taken off. Confirmed the patient's next appointment with him for 02/06/2019. The patient reports he needs a letter for his insurance company stating he has had a mild heart attack. His pcp informed him that his previous records from [facility] and [facility] displayed this however that it needed to come from his cardiologist. I will obtain [facility] records and have Dr. Bing Matter review. Once a decision is made I will follow up with the patient. Patient advised to let us know if he ends up needing a note for work. Patient verbally understands, no further questions.

## 2019-02-04 NOTE — Telephone Encounter (Signed)
Patient informed that per Dr. Bing Matter his [place] hospital records didn't appear to show he had a heart attack therefor we would not be generating that letter. Patient was upset and planned to contact his pcp since his pcp originally told him he did have one. Advised patient to let us know if we can help any further.

## 2019-02-06 ENCOUNTER — Telehealth (INDEPENDENT_AMBULATORY_CARE_PROVIDER_SITE_OTHER): Payer: Self-pay | Admitting: Cardiology

## 2019-02-06 ENCOUNTER — Encounter: Payer: Self-pay | Admitting: Cardiology

## 2019-02-06 ENCOUNTER — Other Ambulatory Visit: Payer: Self-pay

## 2019-02-06 VITALS — BP 143/111 | HR 89

## 2019-02-06 DIAGNOSIS — I420 Dilated cardiomyopathy: Secondary | ICD-10-CM

## 2019-02-06 DIAGNOSIS — I4819 Other persistent atrial fibrillation: Secondary | ICD-10-CM

## 2019-02-06 DIAGNOSIS — I161 Hypertensive emergency: Secondary | ICD-10-CM

## 2019-02-06 DIAGNOSIS — I5043 Acute on chronic combined systolic (congestive) and diastolic (congestive) heart failure: Secondary | ICD-10-CM

## 2019-02-06 DIAGNOSIS — I10 Essential (primary) hypertension: Secondary | ICD-10-CM

## 2019-02-06 HISTORY — DX: Other persistent atrial fibrillation: I48.19

## 2019-02-06 MED ORDER — FUROSEMIDE 40 MG PO TABS: 40.0000 mg | ORAL_TABLET | Freq: Every day | ORAL | 1 refills | Status: DC

## 2019-02-06 NOTE — Patient Instructions (Signed)
Medication Instructions:  INCREASE : Furosemide to 40 mg ( 2 20mg   Tabs) Daily  If you need a refill on your cardiac medications before your next appointment, please call your pharmacy.   Lab work: Your physician recommends that you return for lab work in: 02/09/2019 BMP  If you have labs (blood work) drawn today and your tests are completely normal, you will receive your results only by: Marland Kitchen MyChart Message (if you have MyChart) OR . A paper copy in the mail If you have any lab test that is abnormal or we need to change your treatment, we will call you to review the results.  Testing/Procedures: NOne  Follow-Up: At [facility], you and your health needs are our priority.  As part of our continuing mission to provide you with exceptional heart care, we have created designated Provider Care Teams.  These Care Teams include your primary Cardiologist (physician) and Advanced Practice Providers (APPs -  Physician Assistants and Nurse Practitioners) who all work together to provide you with the care you need, when you need it. You will need a follow up appointment in 1 weeks. Any Other Special Instructions Will Be Listed Below (If Applicable).

## 2019-02-06 NOTE — Progress Notes (Signed)
Virtual Visit via Video Note   This visit type was conducted due to national recommendations for restrictions regarding the COVID-19 Pandemic (e.g. social distancing) in an effort to limit this patient's exposure and mitigate transmission in our community.  Due to his co-morbid illnesses, this patient is at least at moderate risk for complications without adequate follow up.  This format is felt to be most appropriate for this patient at this time.  All issues noted in this document were discussed and addressed.  A limited physical exam was performed with this format.  Please refer to the patient's chart for his consent to telehealth for [facility].  Evaluation Performed:  Follow-up visit  This visit type was conducted due to national recommendations for restrictions regarding the COVID-19 Pandemic (e.g. social distancing).  This format is felt to be most appropriate for this patient at this time.  All issues noted in this document were discussed and addressed.  No physical exam was performed (except for noted visual exam findings with Video Visits).  Please refer to the patient's chart (MyChart message for video visits and phone note for telephone visits) for the patient's consent to telehealth [facility].  Date:  02/06/2019  ID: David Braun, DOB 05/02/1956, MRN [id number]   Patient Location: [address] [place] [id number]   [facility]   [facility] [place] [facility]  PCP:  Eloisa NorthernAmin, Saad, MD  Cardiologist:  Gypsy Balsamobert Krasowski, MD     Chief Complaint: I have shortness of breath at night  History of Present Illness:    David Braun is a 63 y.o. male  who presents via audio/video conferencing for a telehealth visit today.  Past medical history significant for cardiomyopathy ejection fraction 45%.  Also atrial fibrillation.  He was admitted to the hospital because of atrial fibrillation with fast ventricular rate.  Also his blood pressure was very high.  He had a lot  of questions today to me first question was that he truly had a heart attack.  I told him if he did have minimal spell of troponin but I suspect this is acute coronary syndrome type II I do not think he got thrombo-occlusive event.  I suspect troponin I was elevated because of high blood pressure as well as because of tachycardia.  He understood this.  He is doing fair he complained that he had difficulty sleeping at night because of shortness of breath however he changed some sequence of his medication that he takes and he said he is doing better obviously more about potentially congestive heart failure he also described to have some shortness of breath while doing things.  I asked him to temporarily increase the dose of furosemide to see if that helps with his breathing.  He also send me a reading of his EKG from a cardiac ventricle rate of atrial fibrillation still will be excessive about 111.  He said however when he check it sometimes at different times it will be 70 or so.  I was hoping that he converted to sinus rhythm but so far still atrial fibrillation.   The patient does not have symptoms concerning for COVID-19 infection (fever, chills, cough, or new SHORTNESS OF BREATH).    Prior CV studies:   The following studies were reviewed today:       No past medical history on file.     Current Meds  Medication Sig  . allopurinol (ZYLOPRIM) 300 MG tablet Take 1 tablet by mouth daily.  .Marland Kitchen  amiodarone (PACERONE) 200 MG tablet Take 1 tablet (200 mg total) by mouth 3 (three) times daily.  Marland Kitchen apixaban (ELIQUIS) 5 MG TABS tablet Take 5 mg by mouth 2 (two) times daily.  Marland Kitchen aspirin EC 81 MG tablet Take 1 tablet by mouth daily.  . carvedilol (COREG) 25 MG tablet Take 1 tablet (25 mg total) by mouth 2 (two) times daily.  . furosemide (LASIX) 40 MG tablet Take 1 tablet (40 mg total) by mouth daily.  . [DISCONTINUED] furosemide (LASIX) 20 MG tablet Take 20 mg by mouth daily.      Family History:  The patient's family history includes Diabetes in his mother; Hypertension in his father and mother.   ROS:   Please see the history of present illness.     All other systems reviewed and are negative.   Labs/Other Tests and Data Reviewed:     Recent Labs: 01/12/2019: BUN 17; Creatinine, Ser 1.30; Potassium 4.3; Sodium 142  Recent Lipid Panel No results found for: CHOL, TRIG, HDL, CHOLHDL, VLDL, LDLCALC, LDLDIRECT    Exam:    Vital Signs:  BP (!) 143/111   Pulse 89     Wt Readings from Last 3 Encounters:  01/23/19 231 lb (104.8 kg)  01/12/19 232 lb 3.2 oz (105.3 kg)  01/06/19 221 lb (100.2 kg)     Well nourished, well developed in no acute distress. See him through the video link seems to be comfortable denies having any chest pain tightness squeezing pressure burning in the chest at this time.  Diagnosis for this visit:   1. Hypertensive emergency   2. CHF (congestive heart failure), NYHA class III, acute on chronic, combined (HCC)   3. Essential hypertension   4. Persistent atrial fibrillation   5. Dilated cardiomyopathy (HCC)      ASSESSMENT & PLAN:    1.  Hypertension.  Blood pressure appears to be better controlled we will continue present management. 2.  Congestive heart failure.  Plan will be to increase dose of diuretic to see if we can improve his shortness of breath tonight.  We will contact him at the beginning of next week to see how he does. 3.  Persistent atrial fibrillation.  Plan is to continue with furosemide as well as amiodarone to see he feels better hopefully convert to sinus rhythm if not we will make arrangements for electrical cardioversion probably at the end of next week or following week.  In the meantime we will continue with amiodarone 200 mg 3 times daily. 4.  Dilated cardiomyopathy on appropriate medication that he is able to tolerate any now I hope ejection fraction will improve after cardioversion.  COVID-19 Education: The signs and  symptoms of COVID-19 were discussed with the patient and how to seek care for testing (follow up with PCP or arrange E-visit).  The importance of social distancing was discussed today.  Patient Risk:   After full review of this patients clinical status, I feel that they are at least moderate risk at this time.  Time:   Today, I have spent 21 minutes with the patient with telehealth technology discussing pt health issues.  I spent reviewing her chart before the visit.  Visit was finished at 3:37 PM.    Medication Adjustments/Labs and Tests Ordered: Current medicines are reviewed at length with the patient today.  Concerns regarding medicines are outlined above.  Orders Placed This Encounter  Procedures  . Basic Metabolic Panel (BMET)   Medication changes:  Meds  ordered this encounter  Medications  . furosemide (LASIX) 40 MG tablet    Sig: Take 1 tablet (40 mg total) by mouth daily.    Dispense:  90 tablet    Refill:  1     Disposition: Will see him back next week decision will be made about cardioversion at that time.  Signed, Georgeanna Lea, MD, [facility] 02/06/2019 6:18 PM    [facility]

## 2019-02-09 LAB — BASIC METABOLIC PANEL
BUN/Creatinine Ratio: 14 (ref 10–24)
BUN: 21 mg/dL (ref 8–27)
CO2: 23 mmol/L (ref 20–29)
Calcium: 9.7 mg/dL (ref 8.6–10.2)
Chloride: 97 mmol/L (ref 96–106)
Creatinine, Ser: 1.53 mg/dL — ABNORMAL HIGH (ref 0.76–1.27)
GFR calc Af Amer: 56 mL/min/{1.73_m2} — ABNORMAL LOW (ref >59)
GFR calc non Af Amer: 48 mL/min/{1.73_m2} — ABNORMAL LOW (ref >59)
Glucose: 140 mg/dL — ABNORMAL HIGH (ref 65–99)
Potassium: 3.9 mmol/L (ref 3.5–5.2)
Sodium: 136 mmol/L (ref 134–144)

## 2019-02-11 ENCOUNTER — Other Ambulatory Visit: Payer: Self-pay | Admitting: Cardiology

## 2019-02-11 MED ORDER — APIXABAN 5 MG PO TABS: 5.0000 mg | ORAL_TABLET | Freq: Two times a day (BID) | ORAL | 1 refills | Status: DC

## 2019-02-11 NOTE — Telephone Encounter (Signed)
Patient is on Aspirin 81 mg. and Eliquis 5 mg BID, should he be on both therapies?

## 2019-02-11 NOTE — Telephone Encounter (Signed)
°*  STAT* If patient is at the pharmacy, call can be transferred to refill team.   1. Which medications need to be refilled? (please list name of each medication and dose if known) apixaban (ELIQUIS) 5 MG TABS   2. Which pharmacy/location (including street and city if local pharmacy) is medication to be sent to?  [id number] - [place], [place] - [address] [phone] (Phone) [phone] (Fax)    3. Do they need a 30 day or 90 day supply? 90 day

## 2019-02-12 ENCOUNTER — Telehealth: Payer: Self-pay | Admitting: Cardiology

## 2019-02-12 ENCOUNTER — Other Ambulatory Visit: Payer: Self-pay

## 2019-02-12 ENCOUNTER — Telehealth: Payer: Self-pay | Admitting: Emergency Medicine

## 2019-02-12 ENCOUNTER — Encounter: Payer: Self-pay | Admitting: Cardiology

## 2019-02-12 MED ORDER — APIXABAN 5 MG PO TABS: 5.0000 mg | ORAL_TABLET | Freq: Two times a day (BID) | ORAL | 1 refills | Status: DC

## 2019-02-12 NOTE — Telephone Encounter (Signed)
Called patient per Dr. Bing Matter and scheduled him for a video visit in 2 weeks. The patient cancelled the appointment for today due to not having taken elquis since Monday. Retia Passe, CMA sent in rx and patient scheduled for two week follow up.

## 2019-02-26 ENCOUNTER — Encounter: Payer: Self-pay | Admitting: Cardiology

## 2019-02-26 ENCOUNTER — Other Ambulatory Visit: Payer: Self-pay

## 2019-02-26 ENCOUNTER — Telehealth (INDEPENDENT_AMBULATORY_CARE_PROVIDER_SITE_OTHER): Payer: Self-pay | Admitting: Cardiology

## 2019-02-26 VITALS — BP 175/129 | HR 122 | Wt 226.0 lb

## 2019-02-26 DIAGNOSIS — Z7901 Long term (current) use of anticoagulants: Secondary | ICD-10-CM

## 2019-02-26 DIAGNOSIS — I4891 Unspecified atrial fibrillation: Secondary | ICD-10-CM

## 2019-02-26 DIAGNOSIS — I10 Essential (primary) hypertension: Secondary | ICD-10-CM

## 2019-02-26 DIAGNOSIS — I110 Hypertensive heart disease with heart failure: Secondary | ICD-10-CM

## 2019-02-26 DIAGNOSIS — I428 Other cardiomyopathies: Secondary | ICD-10-CM

## 2019-02-26 DIAGNOSIS — I5043 Acute on chronic combined systolic (congestive) and diastolic (congestive) heart failure: Secondary | ICD-10-CM

## 2019-02-26 DIAGNOSIS — I4819 Other persistent atrial fibrillation: Secondary | ICD-10-CM

## 2019-02-26 NOTE — Patient Instructions (Signed)
Medication Instructions:  Your physician recommends that you continue on your current medications as directed. Please refer to the Current Medication list given to you today.  If you need a refill on your cardiac medications before your next appointment, please call your pharmacy.   Lab work: Your physician recommends that you return for lab work today or tomorrow: bmp   If you have labs (blood work) drawn today and your tests are completely normal, you will receive your results only by: Marland Kitchen MyChart Message (if you have MyChart) OR . A paper copy in the mail If you have any lab test that is abnormal or we need to change your treatment, we will call you to review the results.  Testing/Procedures: None.   Follow-Up: At [facility], you and your health needs are our priority.  As part of our continuing mission to provide you with exceptional heart care, we have created designated Provider Care Teams.  These Care Teams include your primary Cardiologist (physician) and Advanced Practice Providers (APPs -  Physician Assistants and Nurse Practitioners) who all work together to provide you with the care you need, when you need it. You will need a follow up appointment in 2 weeks.  Please call our office 2 months in advance to schedule this appointment.  You may see No primary care provider on file. or another member of our [employer] Provider Team in [place]: Norman Herrlich, MD . Belva Crome, MD  Any Other Special Instructions Will Be Listed Below (If Applicable).

## 2019-02-26 NOTE — Progress Notes (Signed)
Virtual Visit via Video Note   This visit type was conducted due to national recommendations for restrictions regarding the COVID-19 Pandemic (e.g. social distancing) in an effort to limit this patient's exposure and mitigate transmission in our community.  Due to his co-morbid illnesses, this patient is at least at moderate risk for complications without adequate follow up.  This format is felt to be most appropriate for this patient at this time.  All issues noted in this document were discussed and addressed.  A limited physical exam was performed with this format.  Please refer to the patient's chart for his consent to telehealth for [facility].  Evaluation Performed:  Follow-up visit  This visit type was conducted due to national recommendations for restrictions regarding the COVID-19 Pandemic (e.g. social distancing).  This format is felt to be most appropriate for this patient at this time.  All issues noted in this document were discussed and addressed.  No physical exam was performed (except for noted visual exam findings with Video Visits).  Please refer to the patient's chart (MyChart message for video visits and phone note for telephone visits) for the patient's consent to telehealth for [facility].  Date:  02/26/2019  ID: David Braun, DOB 04-13-1956, MRN [id number]   Patient Location: [address] [place] [address]   Provider location:   [facility] [place] [facility]  PCP:  Eloisa Northern, MD  Cardiologist:  Gypsy Balsam, MD     Chief Complaint: Doing well  History of Present Illness:    David Braun is a 63 y.o. male  who presents via audio/video conferencing for a telehealth visit today.  With cardiomyopathy ejection fraction 40 to 45%, atrial fibrillation, we hoping that he will convert with amiodarone however if not we planning to do cardioversion.  He is being anticoagulated, his anticoagulation was interrupted few days ago therefore we need to wait for  another 2 weeks before attempting cardioversion on top of that cardioversion is not being done in hospital because of coronavirus situation.  Overall he reports to have a little bit more shortness of breath he also does not feel as good as last time when we spoke   The patient does not have symptoms concerning for COVID-19 infection (fever, chills, cough, or new SHORTNESS OF BREATH).    Prior CV studies:   The following studies were reviewed today:       No past medical history on file.     Current Meds  Medication Sig   allopurinol (ZYLOPRIM) 300 MG tablet Take 1 tablet by mouth daily.   amiodarone (PACERONE) 200 MG tablet Take 1 tablet (200 mg total) by mouth 3 (three) times daily.   apixaban (ELIQUIS) 5 MG TABS tablet Take 1 tablet (5 mg total) by mouth 2 (two) times daily.   aspirin EC 81 MG tablet Take 1 tablet by mouth daily.   carvedilol (COREG) 25 MG tablet Take 1 tablet (25 mg total) by mouth 2 (two) times daily.   furosemide (LASIX) 40 MG tablet Take 1 tablet (40 mg total) by mouth daily.      Family History: The patient's family history includes Diabetes in his mother; Hypertension in his father and mother.   ROS:   Please see the history of present illness.     All other systems reviewed and are negative.   Labs/Other Tests and Data Reviewed:     Recent Labs: 02/09/2019: BUN 21; Creatinine, Ser 1.53; Potassium 3.9; Sodium 136  Recent Lipid Panel No results found for: CHOL, TRIG, HDL, CHOLHDL, VLDL, LDLCALC, LDLDIRECT    Exam:    Vital Signs:  BP (!) 175/129    Pulse (!) 122    Wt 226 lb (102.5 kg)    BMI 32.43 kg/m     Wt Readings from Last 3 Encounters:  02/26/19 226 lb (102.5 kg)  01/23/19 231 lb (104.8 kg)  01/12/19 232 lb 3.2 oz (105.3 kg)     Well nourished, well developed in no acute distress. Alert awake oriented x3 not in any distress at the moment of my conversation  Diagnosis for this visit:   1. Essential hypertension   2.  CHF (congestive heart failure), NYHA class III, acute on chronic, combined (HCC)   3. Atrial fibrillation with RVR (HCC)   4. Persistent atrial fibrillation      ASSESSMENT & PLAN:    1.  Essential hypertension I will check Chem-7 if Chem-7 is fine will increase dose of furosemide due to papillary congestion as well as with high blood pressure. 2.  Congestive heart failure appears to be mildly decompensated will check Chem-7 accordingly. 3.  Atrial fibrillation which appears to be persistent right now continue anticoagulation the goal is to convert him to sinus rhythm within next 2 weeks if he will not convert with amiodarone.  COVID-19 Education: The signs and symptoms of COVID-19 were discussed with the patient and how to seek care for testing (follow up with PCP or arrange E-visit).  The importance of social distancing was discussed today.  Patient Risk:   After full review of this patients clinical status, I feel that they are at least moderate risk at this time.  Time:   Today, I have spent 15 minutes with the patient with telehealth technology discussing pt health issues.  I spent 5 minutes reviewing her chart before the visit.  Visit was finished at 11:58 AM.    Medication Adjustments/Labs and Tests Ordered: Current medicines are reviewed at length with the patient today.  Concerns regarding medicines are outlined above.  Orders Placed This Encounter  Procedures   Basic metabolic panel   Medication changes: No orders of the defined types were placed in this encounter.    Disposition: Follow-up 1 month  Signed, Georgeanna Lea, MD, [facility] 02/26/2019 11:58 AM    [facility]

## 2019-02-27 LAB — BASIC METABOLIC PANEL
BUN/Creatinine Ratio: 12 (ref 10–24)
BUN: 21 mg/dL (ref 8–27)
CO2: 27 mmol/L (ref 20–29)
Calcium: 9.7 mg/dL (ref 8.6–10.2)
Chloride: 95 mmol/L — ABNORMAL LOW (ref 96–106)
Creatinine, Ser: 1.71 mg/dL — ABNORMAL HIGH (ref 0.76–1.27)
GFR calc Af Amer: 49 mL/min/{1.73_m2} — ABNORMAL LOW (ref >59)
GFR calc non Af Amer: 42 mL/min/{1.73_m2} — ABNORMAL LOW (ref >59)
Glucose: 161 mg/dL — ABNORMAL HIGH (ref 65–99)
Potassium: 3.6 mmol/L (ref 3.5–5.2)
Sodium: 140 mmol/L (ref 134–144)

## 2019-03-05 ENCOUNTER — Telehealth: Payer: Self-pay | Admitting: Emergency Medicine

## 2019-03-05 DIAGNOSIS — I10 Essential (primary) hypertension: Secondary | ICD-10-CM

## 2019-03-05 NOTE — Telephone Encounter (Signed)
Patient called back and was informed of lab results and to have labs redrawn in 2 weeks. Patient verbally understands.

## 2019-03-05 NOTE — Addendum Note (Signed)
Addended by: Lita Mains on: 03/05/2019 10:54 AM   Modules accepted: Orders

## 2019-03-05 NOTE — Telephone Encounter (Signed)
Left message for patient to return call regarding lab results.  

## 2019-03-12 ENCOUNTER — Encounter: Payer: Self-pay | Admitting: Cardiology

## 2019-03-12 ENCOUNTER — Telehealth: Payer: Self-pay | Admitting: Emergency Medicine

## 2019-03-12 ENCOUNTER — Telehealth (INDEPENDENT_AMBULATORY_CARE_PROVIDER_SITE_OTHER): Payer: Self-pay | Admitting: Cardiology

## 2019-03-12 ENCOUNTER — Other Ambulatory Visit: Payer: Self-pay

## 2019-03-12 VITALS — BP 153/126 | Wt 216.0 lb

## 2019-03-12 DIAGNOSIS — I10 Essential (primary) hypertension: Secondary | ICD-10-CM

## 2019-03-12 DIAGNOSIS — I4819 Other persistent atrial fibrillation: Secondary | ICD-10-CM

## 2019-03-12 DIAGNOSIS — I5043 Acute on chronic combined systolic (congestive) and diastolic (congestive) heart failure: Secondary | ICD-10-CM

## 2019-03-12 NOTE — Progress Notes (Signed)
Virtual Visit via Video Note   This visit type was conducted due to national recommendations for restrictions regarding the COVID-19 Pandemic (e.g. social distancing) in an effort to limit this patient's exposure and mitigate transmission in our community.  Due to his co-morbid illnesses, this patient is at least at moderate risk for complications without adequate follow up.  This format is felt to be most appropriate for this patient at this time.  All issues noted in this document were discussed and addressed.  A limited physical exam was performed with this format.  Please refer to the patient's chart for his consent to telehealth for [facility].  Evaluation Performed:  Follow-up visit  This visit type was conducted due to national recommendations for restrictions regarding the COVID-19 Pandemic (e.g. social distancing).  This format is felt to be most appropriate for this patient at this time.  All issues noted in this document were discussed and addressed.  No physical exam was performed (except for noted visual exam findings with Video Visits).  Please refer to the patient's chart (MyChart message for video visits and phone note for telephone visits) for the patient's consent to telehealth for [facility].  Date:  03/12/2019  ID: David Braun, DOB 05/05/1956, MRN [id number]   Patient Location: [address] [place] [address]   Provider location:   [facility] [place] [facility]  PCP:  Eloisa Northern, MD  Cardiologist:  Gypsy Balsam, MD     Chief Complaint: I am doing better  History of Present Illness:    David Braun is a 63 y.o. male  who presents via audio/video conferencing for a telehealth visit today.  With persistent atrial fibrillation has been anticoagulated with Eliquis for at least 3 weeks right now.  In the medevac is been taking Eliquis for 2 months however he got a gap of about 5 days about 3 weeks ago when he ran out of medications.  We are getting ready  to convert him electrically.  He is been taking amiodarone which I will continue.  He also got diminished ejection fraction 40 to 45% overall now hemodynamically stable.  The purpose of today's visit is to talk about cardioversion.  I explained procedure to him including all risk benefits as well as alternative he is willing to proceed he also told me that he did some investigation himself including talking to his friends who had it done and he eagerly wants to proceed.   The patient does not have symptoms concerning for COVID-19 infection (fever, chills, cough, or new SHORTNESS OF BREATH).    Prior CV studies:   The following studies were reviewed today:       No past medical history on file.     Current Meds  Medication Sig  . allopurinol (ZYLOPRIM) 300 MG tablet Take 1 tablet by mouth daily.  Marland Kitchen amiodarone (PACERONE) 200 MG tablet Take 1 tablet (200 mg total) by mouth 3 (three) times daily.  Marland Kitchen apixaban (ELIQUIS) 5 MG TABS tablet Take 1 tablet (5 mg total) by mouth 2 (two) times daily.  Marland Kitchen aspirin EC 81 MG tablet Take 1 tablet by mouth daily.  . carvedilol (COREG) 25 MG tablet Take 1 tablet (25 mg total) by mouth 2 (two) times daily.  . furosemide (LASIX) 40 MG tablet Take 1 tablet (40 mg total) by mouth daily.      Family History: The patient's family history includes Diabetes in his mother; Hypertension in his father and mother.  ROS:   Please see the history of present illness.     All other systems reviewed and are negative.   Labs/Other Tests and Data Reviewed:     Recent Labs: 02/27/2019: BUN 21; Creatinine, Ser 1.71; Potassium 3.6; Sodium 140  Recent Lipid Panel No results found for: CHOL, TRIG, HDL, CHOLHDL, VLDL, LDLCALC, LDLDIRECT    Exam:    Vital Signs:  BP (!) 153/126   Wt 216 lb (98 kg)   BMI 30.99 kg/m     Wt Readings from Last 3 Encounters:  03/12/19 216 lb (98 kg)  02/26/19 226 lb (102.5 kg)  01/23/19 231 lb (104.8 kg)     Well  nourished, well developed in no acute distress. Alert awake oriented x3 not in any distress doing well he is that he is feeling better he did have some issue with shortness of breath we gave him a little more diuretic situation improved  Diagnosis for this visit:   1. Essential hypertension   2. CHF (congestive heart failure), NYHA class III, acute on chronic, combined (HCC)   3. Persistent atrial fibrillation      ASSESSMENT & PLAN:    1.  Essential hypertension blood pressure controlled continue present management.  2.  Persistent atrial fibrillation plan is to proceed with cardioversion. 3.  Congestive heart failure.  Ejection fraction 40 to 45%.  He is on carvedilol, not on ACE inhibitor, not on ARB, not on Entresto secondary to kidney dysfunction.  Last creatinine 1.71 with GFR of 42.  Obviously this is a issue that will be evaluated every single time with hope to be able to put him on those medications if not I hope that his ejection fraction will improve after cardioversion if not will initiate much stronger vasodilatation.  COVID-19 Education: The signs and symptoms of COVID-19 were discussed with the patient and how to seek care for testing (follow up with PCP or arrange E-visit).  The importance of social distancing was discussed today.  Patient Risk:   After full review of this patients clinical status, I feel that they are at least moderate risk at this time.  Time:   Today, I have spent 18 minutes with the patient with telehealth technology discussing pt health issues.  I spent 5 minutes reviewing her chart before the visit.  Visit was finished at 11:08 AM.    Medication Adjustments/Labs and Tests Ordered: Current medicines are reviewed at length with the patient today.  Concerns regarding medicines are outlined above.  No orders of the defined types were placed in this encounter.  Medication changes: No orders of the defined types were placed in this encounter.     Disposition: Follow-up next month  Signed, Georgeanna Lea, MD, [facility] 03/12/2019 11:07 AM    [facility]

## 2019-03-12 NOTE — Telephone Encounter (Signed)
Left message for patient to return call to go over discharge and cardioversion procedure instructions.

## 2019-03-12 NOTE — Telephone Encounter (Signed)
Pt returned your call.  

## 2019-03-12 NOTE — Patient Instructions (Signed)
Medication Instructions:  Your physician recommends that you continue on your current medications as directed. Please refer to the Current Medication list given to you today.  If you need a refill on your cardiac medications before your next appointment, please call your pharmacy.   Lab work: Your physician recommends that you return for lab work within a week of your cardioverison: bmp, cbc   If you have labs (blood work) drawn today and your tests are completely normal, you will receive your results only by:  MyChart Message (if you have MyChart) OR  A paper copy in the mail If you have any lab test that is abnormal or we need to change your treatment, we will call you to review the results.  Testing/Procedures: Dear Marcial Pacas,  You will be scheduled for a Cardioversion and [facility].   DIET: Nothing to eat or drink after midnight except a sip of water with medications (see medication instructions below)  Medication Instructions: Hold Lasix the day of the procedure   Continue your anticoagulant: Eliquis  You will need to continue your anticoagulant after your procedure until you  are told by your  Provider that it is safe to stop   Labs: You will have labs 1 week before procedure.     You must have a responsible person to drive you home and stay in the waiting area during your procedure. Failure to do so could result in cancellation.  Bring your insurance cards.  *Special Note: Every effort is made to have your procedure done on time. Occasionally there are emergencies that occur at the hospital that may cause delays. Please be patient if a delay does occur.    Follow-Up: At [facility], you and your health needs are our priority.  As part of our continuing mission to provide you with exceptional heart care, we have created designated Provider Care Teams.  These Care Teams include your primary Cardiologist (physician) and Advanced Practice Providers (APPs -   Physician Assistants and Nurse Practitioners) who all work together to provide you with the care you need, when you need it. You will need a follow up appointment in 1 months.  Please call our office 2 months in advance to schedule this appointment.  You may see No primary care provider on file. or another member of our [employer] Provider Team in [place]: Norman Herrlich, MD  Belva Crome, MD  Any Other Special Instructions Will Be Listed Below (If Applicable).   Electrical Cardioversion  Electrical cardioversion is the delivery of a jolt of electricity to restore a normal rhythm to the heart. A rhythm that is too fast or is not regular keeps the heart from pumping well. In this procedure, sticky patches or metal paddles are placed on the chest to deliver electricity to the heart from a device. This procedure may be done in an emergency if:  There is low or no blood pressure as a result of the heart rhythm.  Normal rhythm must be restored as fast as possible to protect the brain and heart from further damage.  It may save a life. This procedure may also be done for irregular or fast heart rhythms that are not immediately life-threatening. Tell a health care provider about:  Any allergies you have.  All medicines you are taking, including vitamins, herbs, eye drops, creams, and over-the-counter medicines.  Any problems you or family members have had with anesthetic medicines.  Any blood disorders you have.  Any surgeries you have had.  Any medical conditions you have.  Whether you are pregnant or may be pregnant. What are the risks? Generally, this is a safe procedure. However, problems may occur, including:  Allergic reactions to medicines.  A blood clot that breaks free and travels to other parts of your body.  The possible return of an abnormal heart rhythm within hours or days after the procedure.  Your heart stopping (cardiac arrest). This is rare. What happens  before the procedure? Medicines  Your health care provider may have you start taking: ? Blood-thinning medicines (anticoagulants) so your blood does not clot as easily. ? Medicines may be given to help stabilize your heart rate and rhythm.  Ask your health care provider about changing or stopping your regular medicines. This is especially important if you are taking diabetes medicines or blood thinners. General instructions  Plan to have someone take you home from the hospital or clinic.  If you will be going home right after the procedure, plan to have someone with you for 24 hours.  Follow instructions from your health care provider about eating or drinking restrictions. What happens during the procedure?  To lower your risk of infection: ? Your health care team will wash or sanitize their hands. ? Your skin will be washed with soap.  An IV tube will be inserted into one of your veins.  You will be given a medicine to help you relax (sedative).  Sticky patches (electrodes) or metal paddles may be placed on your chest.  An electrical shock will be delivered. The procedure may vary among health care providers and hospitals. What happens after the procedure?   Your blood pressure, heart rate, breathing rate, and blood oxygen level will be monitored until the medicines you were given have worn off.  Do not drive for 24 hours if you were given a sedative.  Your heart rhythm will be watched to make sure it does not change. This information is not intended to replace advice given to you by your health care provider. Make sure you discuss any questions you have with your health care provider. Document Released: 09/14/2002 Document Revised: 05/23/2016 Document Reviewed: 03/30/2016 Elsevier Interactive Patient Education  2019 [employer].

## 2019-03-13 NOTE — Telephone Encounter (Signed)
Called patient and informed him of discharge instructions yesterday. Went over instructions for cardioversion. Informed him we will call and let him know when we are able to get the cardioversion scheduled at [facility]. Scheduled follow up appt while on the phone with patient. No further questions.

## 2019-03-16 ENCOUNTER — Telehealth: Payer: Self-pay | Admitting: *Deleted

## 2019-03-16 DIAGNOSIS — I4892 Unspecified atrial flutter: Secondary | ICD-10-CM

## 2019-03-16 NOTE — Telephone Encounter (Signed)
[name] message that the patient was not told by Dr. Agustin Cree a date or time for the cardioversion. I asked her to call me with any other needs.

## 2019-03-16 NOTE — Telephone Encounter (Signed)
Called Kieth Brightly back discussed cardioversion scheduling with her. She reports patient was told his procedure would be done on Wednesday , will confirm with Dr. Agustin Cree and call [place] back.

## 2019-03-16 NOTE — Telephone Encounter (Signed)
Called Patient informed him that he will have cardioversion at [facility] on Thursday 03/19/2019 and will be called the day before by the hospital with the time per [facility]. Patient verbally understands.

## 2019-03-16 NOTE — Telephone Encounter (Signed)
Penny at [facility] called to verify pt's appt for cardioversion. Please call her at [phone] to let her know.

## 2019-03-17 DIAGNOSIS — I4891 Unspecified atrial fibrillation: Secondary | ICD-10-CM

## 2019-03-17 NOTE — Addendum Note (Signed)
Addended by: Linna Hoff R on: 03/17/2019 11:15 AM   Modules accepted: Orders

## 2019-03-19 DIAGNOSIS — I4892 Unspecified atrial flutter: Secondary | ICD-10-CM

## 2019-03-19 HISTORY — PX: CARDIOVERSION: SHX1299

## 2019-04-17 ENCOUNTER — Encounter: Payer: Self-pay | Admitting: Cardiology

## 2019-04-17 ENCOUNTER — Other Ambulatory Visit: Payer: Self-pay

## 2019-04-17 ENCOUNTER — Telehealth (INDEPENDENT_AMBULATORY_CARE_PROVIDER_SITE_OTHER): Payer: Self-pay | Admitting: Cardiology

## 2019-04-17 DIAGNOSIS — I420 Dilated cardiomyopathy: Secondary | ICD-10-CM

## 2019-04-17 DIAGNOSIS — Z7901 Long term (current) use of anticoagulants: Secondary | ICD-10-CM

## 2019-04-17 DIAGNOSIS — I10 Essential (primary) hypertension: Secondary | ICD-10-CM

## 2019-04-17 DIAGNOSIS — I4819 Other persistent atrial fibrillation: Secondary | ICD-10-CM

## 2019-04-17 NOTE — Progress Notes (Signed)
error 

## 2022-05-23 ENCOUNTER — Encounter: Payer: Self-pay | Admitting: Cardiology

## 2022-05-23 ENCOUNTER — Encounter: Payer: Self-pay | Admitting: *Deleted

## 2022-06-20 ENCOUNTER — Ambulatory Visit: Payer: Medicare HMO | Attending: Cardiology | Admitting: Cardiology

## 2022-06-20 ENCOUNTER — Encounter: Payer: Self-pay | Admitting: Cardiology

## 2022-06-20 VITALS — BP 160/80 | HR 61 | Ht 70.0 in | Wt 241.2 lb

## 2022-06-20 DIAGNOSIS — I10 Essential (primary) hypertension: Secondary | ICD-10-CM | POA: Diagnosis not present

## 2022-06-20 DIAGNOSIS — I4891 Unspecified atrial fibrillation: Secondary | ICD-10-CM | POA: Diagnosis not present

## 2022-06-20 DIAGNOSIS — E785 Hyperlipidemia, unspecified: Secondary | ICD-10-CM | POA: Diagnosis not present

## 2022-06-20 DIAGNOSIS — I420 Dilated cardiomyopathy: Secondary | ICD-10-CM

## 2022-06-20 DIAGNOSIS — I5043 Acute on chronic combined systolic (congestive) and diastolic (congestive) heart failure: Secondary | ICD-10-CM

## 2022-06-20 MED ORDER — APIXABAN 5 MG PO TABS
5.0000 mg | ORAL_TABLET | Freq: Two times a day (BID) | ORAL | 3 refills | Status: AC
Start: 2022-06-20 — End: ?

## 2022-06-20 MED ORDER — ROSUVASTATIN CALCIUM 10 MG PO TABS: 10.0000 mg | ORAL_TABLET | Freq: Every day | ORAL | 3 refills | Status: DC

## 2022-06-20 NOTE — Progress Notes (Signed)
Cardiology Consultation:    Date:  06/20/2022   ID:  David Braun, DOB [birth year], MRN [id number]  PCP:  Harlen Labs, NP  Cardiologist:  Gypsy Balsam, MD   Referring MD: Harlen Labs, NP   Chief Complaint  Patient presents with   Establish Care    Last seen 2020    History of Present Illness:    David Braun is a 66 y.o. male who is being seen today for the evaluation of paroxysmal atrial fibrillation at the request of Harlen Labs, NP.  I had a pleasure to take care of him in 2020 at that time he was find to be in atrial fibrillation fast ventricular rate, required cardioversion, also had cardiomyopathy with ejection fraction 40 to 45%.  He disappeared from follow-up and now he showed up in my office to be reestablished.  Additional problems include dyslipidemia he was taking Crestor however for some reason that medication disappeared from the list of his medications, he also has essential hypertension.  Mostly he said he is doing well he described to have some fatigue tiredness but overall he described the fact that stamina is much better compared to 2020 when I was taking care of him then.  Denies having any palpitations but he does have cardia device and check his EKG on the regular basis did not record any atrial fibrillation.  He works hard and have no difficulty doing this.  There is no swelling of lower extremities there is no chest pain tightness squeezing pressure burning chest  Past Medical History:  Diagnosis Date   Atrial fibrillation with RVR (HCC) 01/05/2019   Chads score of 2   Cardiomyopathy (HCC) 01/12/2019   Ejection fraction 35 to 40% in spring 2020   CHF (congestive heart failure), NYHA class III, acute on chronic, combined (HCC) 10/26/2018   Chronic anticoagulation    Chronic systolic (congestive) heart failure (HCC) 10/26/2018   Ejection fraction < 50%    Essential hypertension 10/28/2018   History of cardioversion    Paroxysmal  atrial flutter (HCC) 01/06/2019   Chads 2 Vascor equals 2   Persistent atrial fibrillation (HCC) 02/06/2019    Past Surgical History:  Procedure Laterality Date   CARDIOVERSION  03/19/2019   RH by Gypsy Balsam, MD   cataract surgery Bilateral     Current Medications: Current Meds  Medication Sig   amLODipine (NORVASC) 10 MG tablet Take 10 mg by mouth daily.   carvedilol (COREG) 25 MG tablet Take 1 tablet (25 mg total) by mouth 2 (two) times daily. (Patient taking differently: Take 25 mg by mouth 2 (two) times daily.)   furosemide (LASIX) 20 MG tablet Take 20 mg by mouth as needed for fluid or edema.   traZODone (DESYREL) 100 MG tablet Take 100 mg by mouth at bedtime as needed for sleep.     Allergies:   Codeine   Social History   Socioeconomic History   Marital status: Single    Spouse name: Not on file   Number of children: Not on file   Years of education: Not on file   Highest education level: Not on file  Occupational History   Not on file  Tobacco Use   Smoking status: Former   Smokeless tobacco: Never  Substance and Sexual Activity   Alcohol use: Yes   Drug use: Not Currently    Types: Marijuana   Sexual activity: Not on file  Other Topics Concern   Not on file  Social History Narrative  Not on file   Social Determinants of Health   Financial Resource Strain: Not on file  Food Insecurity: Not on file  Transportation Needs: Not on file  Physical Activity: Not on file  Stress: Not on file  Social Connections: Not on file     Family History: The patient's family history includes Diabetes in his mother; Hypertension in his father and mother. ROS:   Please see the history of present illness.    All 14 point review of systems negative except as described per history of present illness.  EKGs/Labs/Other Studies Reviewed:    The following studies were reviewed today:   EKG:  EKG is  ordered today.  The ekg ordered today demonstrates normal sinus  rhythm, normal P interval, nonspecific ST segment changes  Recent Labs: No results found for requested labs within last 365 days.  Recent Lipid Panel No results found for: "CHOL", "TRIG", "HDL", "CHOLHDL", "VLDL", "LDLCALC", "LDLDIRECT"  Physical Exam:    VS:  BP (!) 160/80 (BP Location: Left Arm, Patient Position: Sitting)   Pulse 61   Ht 5\' 10"  (1.778 m)   Wt 241 lb 3.2 oz (109.4 kg)   SpO2 92%   BMI 34.61 kg/m     Wt Readings from Last 3 Encounters:  06/20/22 241 lb 3.2 oz (109.4 kg)  05/11/22 240 lb (108.9 kg)  03/12/19 216 lb (98 kg)     GEN:  Well nourished, well developed in no acute distress HEENT: Normal NECK: No JVD; No carotid bruits LYMPHATICS: No lymphadenopathy CARDIAC: RRR, no murmurs, no rubs, no gallops RESPIRATORY:  Clear to auscultation without rales, wheezing or rhonchi  ABDOMEN: Soft, non-tender, non-distended MUSCULOSKELETAL:  No edema; No deformity  SKIN: Warm and dry NEUROLOGIC:  Alert and oriented x 3 PSYCHIATRIC:  Normal affect   ASSESSMENT:    1. Atrial fibrillation with RVR (HCC)   2. Essential hypertension   3. CHF (congestive heart failure), NYHA class III, acute on chronic, combined (HCC)   4. Dilated cardiomyopathy (HCC)   5. Dyslipidemia    PLAN:    In order of problems listed above:  Paroxysmal atrial fibrillation, he is CHADS2 Vascor equals 3.  He is age is 18 he does have history of congestive heart failure he does have essential hypertension, therefore, he need to be anticoagulated.  He was anticoagulated then however discontinue anticoagulation because of excessive price of the medication now he does have insurance and he want to be put back on it.  He denies have any bleeding, I did review blood test done by primary care physician and he is not anemic.  He is scheduled however to have colonoscopy done within next few weeks as a routine screening test. Essential hypertension blood pressure uncontrolled today but this is per his  visit my office will continue monitoring, I will ask him to have an echocardiogram done to assess left ventricle ejection fraction and look for evidence of left ventricle hypertrophy History of cardiomyopathy with ejection fraction 40%.  Overall he is compensated and euvolemic on the physical exam.  I will ask him to have echocardiogram done to check left ventricle ejection fraction. Dyslipidemia I did review his blood test done by primary care physician, he is LDL is 135.  I will put him back on Crestor 10. He does have a history of smoking however quit smoking more than 10 years ago and encouraged him to stay away from this habit   Medication Adjustments/Labs and Tests Ordered: Current medicines  are reviewed at length with the patient today.  Concerns regarding medicines are outlined above.  No orders of the defined types were placed in this encounter.  No orders of the defined types were placed in this encounter.   Signed, Georgeanna Lea, MD, [facility]. 06/20/2022 4:10 PM    [facility]

## 2022-06-20 NOTE — Patient Instructions (Signed)
Medication Instructions:  Your physician has recommended you make the following change in your medication: START: Crestor 10mg  1 tablet daily by mouth START: Eliquis 5mg  1 twice daily by mouth     Lab Work: Your physician recommends that you return for lab work in: 2 weeks You can come Monday through Friday 8:30 am to 12:00 pm and 1:15 to 4:30. You do not need to make an appointment as the order has already been placed. The labs you are going to have done are CBC   Testing/Procedures: Your physician has requested that you have an echocardiogram. Echocardiography is a painless test that uses sound waves to create images of your heart. It provides your doctor with information about the size and shape of your heart and how well your heart's chambers and valves are working. This procedure takes approximately one hour. There are no restrictions for this procedure.    Follow-Up: At [facility], you and your health needs are our priority.  As part of our continuing mission to provide you with exceptional heart care, we have created designated Provider Care Teams.  These Care Teams include your primary Cardiologist (physician) and Advanced Practice Providers (APPs -  Physician Assistants and Nurse Practitioners) who all work together to provide you with the care you need, when you need it.  We recommend signing up for the patient portal called "MyChart".  Sign up information is provided on this After Visit Summary.  MyChart is used to connect with patients for Virtual Visits (Telemedicine).  Patients are able to view lab/test results, encounter notes, upcoming appointments, etc.  Non-urgent messages can be sent to your provider as well.   To learn more about what you can do with MyChart, go to Wednesday.    Your next appointment:   3 month(s)  The format for your next appointment:   In Person  Provider:   [facility], MD    Other Instructions NA

## 2022-06-20 NOTE — Addendum Note (Signed)
Addended by: Baldo Ash D on: 06/20/2022 04:33 PM   Modules accepted: Orders

## 2022-07-11 ENCOUNTER — Ambulatory Visit: Payer: Medicare HMO

## 2022-07-20 ENCOUNTER — Ambulatory Visit: Payer: Medicare HMO | Attending: Cardiology

## 2022-07-20 DIAGNOSIS — I4891 Unspecified atrial fibrillation: Secondary | ICD-10-CM

## 2022-07-20 DIAGNOSIS — I5043 Acute on chronic combined systolic (congestive) and diastolic (congestive) heart failure: Secondary | ICD-10-CM | POA: Diagnosis not present

## 2022-07-20 DIAGNOSIS — I420 Dilated cardiomyopathy: Secondary | ICD-10-CM

## 2022-07-20 LAB — ECHOCARDIOGRAM COMPLETE
AR max vel: 1.69 cm2
AV Area VTI: 1.8 cm2
AV Area mean vel: 1.52 cm2
AV Mean grad: 13 mmHg
AV Peak grad: 21.3 mmHg
Ao pk vel: 2.31 m/s
Area-P 1/2: 3.12 cm2
MV M vel: 3.85 m/s
MV Peak grad: 59.3 mmHg
P 1/2 time: 543 msec
S' Lateral: 2.5 cm

## 2022-07-27 ENCOUNTER — Telehealth: Payer: Self-pay

## 2022-07-27 NOTE — Telephone Encounter (Signed)
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

## 2022-07-27 NOTE — Telephone Encounter (Signed)
Patient notified of results.

## 2022-07-30 DIAGNOSIS — Z7901 Long term (current) use of anticoagulants: Secondary | ICD-10-CM | POA: Diagnosis not present

## 2022-07-30 DIAGNOSIS — E669 Obesity, unspecified: Secondary | ICD-10-CM | POA: Diagnosis not present

## 2022-07-30 DIAGNOSIS — Z8249 Family history of ischemic heart disease and other diseases of the circulatory system: Secondary | ICD-10-CM | POA: Diagnosis not present

## 2022-07-30 DIAGNOSIS — I110 Hypertensive heart disease with heart failure: Secondary | ICD-10-CM | POA: Diagnosis not present

## 2022-07-30 DIAGNOSIS — H269 Unspecified cataract: Secondary | ICD-10-CM | POA: Diagnosis not present

## 2022-07-30 DIAGNOSIS — M109 Gout, unspecified: Secondary | ICD-10-CM | POA: Diagnosis not present

## 2022-07-30 DIAGNOSIS — F419 Anxiety disorder, unspecified: Secondary | ICD-10-CM | POA: Diagnosis not present

## 2022-07-30 DIAGNOSIS — I4891 Unspecified atrial fibrillation: Secondary | ICD-10-CM | POA: Diagnosis not present

## 2022-07-30 DIAGNOSIS — E785 Hyperlipidemia, unspecified: Secondary | ICD-10-CM | POA: Diagnosis not present

## 2022-07-30 DIAGNOSIS — E261 Secondary hyperaldosteronism: Secondary | ICD-10-CM | POA: Diagnosis not present

## 2022-07-30 DIAGNOSIS — I509 Heart failure, unspecified: Secondary | ICD-10-CM | POA: Diagnosis not present

## 2022-07-30 DIAGNOSIS — Z6834 Body mass index (BMI) 34.0-34.9, adult: Secondary | ICD-10-CM | POA: Diagnosis not present

## 2022-07-30 DIAGNOSIS — Z008 Encounter for other general examination: Secondary | ICD-10-CM | POA: Diagnosis not present

## 2022-07-31 DIAGNOSIS — Z86010 Personal history of colonic polyps: Secondary | ICD-10-CM | POA: Diagnosis not present

## 2022-07-31 DIAGNOSIS — Z5181 Encounter for therapeutic drug level monitoring: Secondary | ICD-10-CM | POA: Diagnosis not present

## 2022-07-31 DIAGNOSIS — Z7901 Long term (current) use of anticoagulants: Secondary | ICD-10-CM | POA: Diagnosis not present

## 2022-08-06 ENCOUNTER — Telehealth: Payer: Self-pay | Admitting: Cardiology

## 2022-08-06 NOTE — Telephone Encounter (Signed)
   Pre-operative Risk Assessment    Patient Name: David Braun  DOB: 1956/08/19 MRN: [id number]      Request for Surgical Clearance    Procedure:   Colonoscopy   Date of Surgery:  Clearance 08/21/22                                 Surgeon:  Dr. Lara Mulch  Surgeon's Group or Practice Name:  [facility]  Phone number:  [phone] Fax number:  [phone]   Type of Clearance Requested:   - Pharmacy:  Hold Apixaban (Eliquis) Needs held for 3 days    Type of Anesthesia:  MAC w/o tube    Additional requests/questions:    Signed, April Henson   08/06/2022, 3:37 PM

## 2022-08-08 NOTE — Telephone Encounter (Signed)
   Patient Name: David Braun  DOB: 1955/12/19 MRN: [id number]  Primary Cardiologist: None  Chart reviewed as part of pre-operative protocol coverage. Pre-op clearance already addressed by colleagues in earlier phone notes. To summarize recommendations:  -Only pharmacy clearance requested. Per office protocol, patient can hold Eliquis for 3 days prior to procedure.   Patient will not need bridging with Lovenox (enoxaparin) around procedure  Will route this bundled recommendation to requesting provider via Epic fax function and remove from pre-op pool. Please call with questions.  Elgie Collard, PA-C 08/08/2022, 1:28 PM

## 2022-08-08 NOTE — Telephone Encounter (Signed)
Patient with diagnosis of atrial fibrillation on Eliquis for anticoagulation.    Procedure: colonoscopy Date of procedure: 08/21/22   CHA2DS2-VASc Score = 3   This indicates a 3.2% annual risk of stroke. The patient's score is based upon: CHF History: 1 HTN History: 1 Diabetes History: 0 Stroke History: 0 Vascular Disease History: 0 Age Score: 1 Gender Score: 0    CrCl 86 Platelet count 115  Per office protocol, patient can hold Eliquis for 3 days prior to procedure.   Patient will not need bridging with Lovenox (enoxaparin) around procedure.  **This guidance is not considered finalized until pre-operative APP has relayed final recommendations.**

## 2022-08-10 NOTE — Telephone Encounter (Signed)
Refax clearance status to requesting provider.

## 2022-09-03 ENCOUNTER — Encounter: Payer: Self-pay | Admitting: Internal Medicine

## 2022-09-03 ENCOUNTER — Ambulatory Visit: Payer: Medicare Other | Admitting: Internal Medicine

## 2022-09-03 VITALS — BP 128/78 | HR 62 | Temp 97.3°F | Resp 18 | Ht 70.0 in | Wt 237.8 lb

## 2022-09-03 DIAGNOSIS — R945 Abnormal results of liver function studies: Secondary | ICD-10-CM

## 2022-09-03 DIAGNOSIS — I10 Essential (primary) hypertension: Secondary | ICD-10-CM | POA: Diagnosis not present

## 2022-09-03 DIAGNOSIS — E785 Hyperlipidemia, unspecified: Secondary | ICD-10-CM

## 2022-09-03 DIAGNOSIS — I480 Paroxysmal atrial fibrillation: Secondary | ICD-10-CM | POA: Diagnosis not present

## 2022-09-03 NOTE — Progress Notes (Signed)
   Established Patient Office Visit  Subjective   Patient ID: David Braun, male    DOB: 1956-02-28  Age: 66 y.o. MRN: [id number]  Chief Complaint  Patient presents with   Congestive Heart Failure   Atrial Fibrillation   Hypertension   66 years old male who is here for follow-up.  He has a history of paroxysmal atrial fibrillation and congestive heart failure.  He denies any shortness of breath.  His heart rate is controlled and he is on Eliquis for anticoagulation.  He denies any bleeding. He has a history of hypertension and his blood pressure is controlled on current current blood pressure medication.  He does not check his blood pressure at home.  I have reviewed his medication with him. He also has a history of hyperlipidemia and takes medication.  He try to watch his diet.  He is due for labs today.     Review of Systems  Constitutional: Negative.   HENT: Negative.    Respiratory: Negative.    Cardiovascular: Negative.   Gastrointestinal: Negative.   Neurological: Negative.       Objective:     BP 128/78 (BP Location: Left Arm, Patient Position: Sitting, Cuff Size: Normal)   Pulse 62   Temp (!) 97.3 F (36.3 C) (Temporal)   Resp 18   Ht 5' 10"$  (1.778 m)   Wt 237 lb 12.8 oz (107.9 kg)   SpO2 97%   BMI 34.12 kg/m    Physical Exam Constitutional:      Appearance: Normal appearance. He is obese.  HENT:     Head: Normocephalic and atraumatic.  Eyes:     Extraocular Movements: Extraocular movements intact.     Pupils: Pupils are equal, round, and reactive to light.  Cardiovascular:     Rate and Rhythm: Normal rate and regular rhythm.     Heart sounds: Normal heart sounds.  Pulmonary:     Effort: Pulmonary effort is normal.     Breath sounds: Normal breath sounds.  Abdominal:     General: Bowel sounds are normal.     Palpations: Abdomen is soft.  Neurological:     General: No focal deficit present.     Mental Status: He is alert and oriented to person,  place, and time.      No results found for any visits on 09/03/22.   The ASCVD Risk score (Arnett DK, et al., 2019) failed to calculate for the following reasons:   Cannot find a previous HDL lab   Cannot find a previous total cholesterol lab    Assessment & Plan:   Problem List Items Addressed This Visit       Cardiovascular and Mediastinum   Essential hypertension - Primary    Well-controlled on current medication.      Paroxysmal atrial fibrillation (HCC)    Rate is controlled.  He is on Eliquis for anticoagulation.        Other   Dyslipidemia    We will repeat lipid panel.      Abnormal liver function    I will continue with current medication and monitor liver function.      Relevant Orders   CMP14 + Anion Gap (Completed)    Return in about 3 months (around 12/04/2022).    Garwin Brothers, MD

## 2022-09-04 LAB — CMP14 + ANION GAP
ALT: 36 IU/L (ref 0–44)
AST: 52 IU/L — ABNORMAL HIGH (ref 0–40)
Albumin/Globulin Ratio: 1.3 (ref 1.2–2.2)
Albumin: 4.4 g/dL (ref 3.9–4.9)
Alkaline Phosphatase: 167 IU/L — ABNORMAL HIGH (ref 44–121)
Anion Gap: 18 mmol/L (ref 10.0–18.0)
BUN/Creatinine Ratio: 8 — ABNORMAL LOW (ref 10–24)
BUN: 13 mg/dL (ref 8–27)
Bilirubin Total: 1.1 mg/dL (ref 0.0–1.2)
CO2: 23 mmol/L (ref 20–29)
Calcium: 9.6 mg/dL (ref 8.6–10.2)
Chloride: 102 mmol/L (ref 96–106)
Creatinine, Ser: 1.56 mg/dL — ABNORMAL HIGH (ref 0.76–1.27)
Globulin, Total: 3.4 g/dL (ref 1.5–4.5)
Glucose: 156 mg/dL — ABNORMAL HIGH (ref 70–99)
Potassium: 4.5 mmol/L (ref 3.5–5.2)
Sodium: 143 mmol/L (ref 134–144)
Total Protein: 7.8 g/dL (ref 6.0–8.5)
eGFR: 49 mL/min/{1.73_m2} — ABNORMAL LOW (ref >59)

## 2022-09-19 ENCOUNTER — Ambulatory Visit: Payer: Medicare HMO | Admitting: Cardiology

## 2022-11-20 DIAGNOSIS — H52223 Regular astigmatism, bilateral: Secondary | ICD-10-CM | POA: Diagnosis not present

## 2022-11-20 DIAGNOSIS — Z0100 Encounter for examination of eyes and vision without abnormal findings: Secondary | ICD-10-CM | POA: Diagnosis not present

## 2022-11-26 ENCOUNTER — Ambulatory Visit: Payer: Medicare HMO | Attending: Cardiology | Admitting: Cardiology

## 2022-11-26 ENCOUNTER — Encounter: Payer: Self-pay | Admitting: Cardiology

## 2022-11-26 VITALS — BP 144/80 | HR 62 | Ht 70.0 in | Wt 242.8 lb

## 2022-11-26 DIAGNOSIS — I420 Dilated cardiomyopathy: Secondary | ICD-10-CM | POA: Diagnosis not present

## 2022-11-26 DIAGNOSIS — I480 Paroxysmal atrial fibrillation: Secondary | ICD-10-CM | POA: Diagnosis not present

## 2022-11-26 DIAGNOSIS — I10 Essential (primary) hypertension: Secondary | ICD-10-CM | POA: Diagnosis not present

## 2022-11-26 DIAGNOSIS — E785 Hyperlipidemia, unspecified: Secondary | ICD-10-CM | POA: Diagnosis not present

## 2022-11-26 NOTE — Patient Instructions (Signed)

## 2022-11-26 NOTE — Progress Notes (Signed)
Cardiology Office Note:    Date:  11/26/2022   ID:  David Braun, DOB [birth year], MRN [id number]  PCP:  David Brothers, MD  Cardiologist:  David Campus, MD    Referring MD: David Major, NP   No chief complaint on file. Doing well  History of Present Illness:    David Braun is a 67 y.o. male with past medical history significant for paroxysmal atrial fibrillation, history of cardiomyopathy initially detected in 2020 with ejection fraction 40 to 45%, however, last echocardiogram showed normalization of left ventricle ejection fraction, also essential hypertension, dyslipidemia.  I did see him last time in September when he showed up after not being seen for couple years overall he is doing very well.  He was put on anticoagulation.  Denies have any chest pain tightness squeezing pressure mid chest no palpitations, he does have Kardia device and check his EKG on the regular basis no documented episode of atrial fibrillation since the episode in 2020.   Past Medical History:  Diagnosis Date   Atrial fibrillation with RVR ([place]) 01/05/2019   Chads score of 2   Cardiomyopathy ([place]) 01/12/2019   Ejection fraction 35 to 40% in spring 2020   CHF (congestive heart failure), NYHA class III, acute on chronic, combined ([place]) 10/26/2018   Chronic anticoagulation    Chronic systolic (congestive) heart failure ([place]) 10/26/2018   Ejection fraction < 50%    Essential hypertension 10/28/2018   History of cardioversion    Paroxysmal atrial flutter ([place]) 01/06/2019   Chads 2 Vascor equals 2   Persistent atrial fibrillation ([place]) 02/06/2019    Past Surgical History:  Procedure Laterality Date   CARDIOVERSION  03/19/2019   RH by David Campus, MD   cataract surgery Bilateral     Current Medications: Current Meds  Medication Sig   amLODipine (NORVASC) 10 MG tablet Take 10 mg by mouth daily.   apixaban (ELIQUIS) 5 MG TABS tablet Take 1 tablet (5 mg total) by mouth 2 (two) times  daily.   carvedilol (COREG) 25 MG tablet Take 1 tablet (25 mg total) by mouth 2 (two) times daily. (Patient taking differently: Take 25 mg by mouth 2 (two) times daily.)   furosemide (LASIX) 20 MG tablet Take 20 mg by mouth as needed for fluid or edema.   rosuvastatin (CRESTOR) 10 MG tablet Take 1 tablet (10 mg total) by mouth daily.   traZODone (DESYREL) 100 MG tablet Take 100 mg by mouth at bedtime as needed for sleep.     Allergies:   Codeine   Social History   Socioeconomic History   Marital status: Single    Spouse name: Not on file   Number of children: Not on file   Years of education: Not on file   Highest education level: Not on file  Occupational History   Not on file  Tobacco Use   Smoking status: Former   Smokeless tobacco: Never  Substance and Sexual Activity   Alcohol use: Yes   Drug use: Not Currently    Types: Marijuana   Sexual activity: Not on file  Other Topics Concern   Not on file  Social History Narrative   Not on file   Social Determinants of Health   Financial Resource Strain: Not on file  Food Insecurity: Not on file  Transportation Needs: Not on file  Physical Activity: Not on file  Stress: Not on file  Social Connections: Not on file     Family History: The patient's family  history includes Diabetes in his mother; Hypertension in his father and mother. ROS:   Please see the history of present illness.    All 14 point review of systems negative except as described per history of present illness  EKGs/Labs/Other Studies Reviewed:      Recent Labs: 09/03/2022: ALT 36; BUN 13; Creatinine, Ser 1.56; Potassium 4.5; Sodium 143  Recent Lipid Panel No results found for: "CHOL", "TRIG", "HDL", "CHOLHDL", "VLDL", "LDLCALC", "LDLDIRECT"  Physical Exam:    VS:  BP (!) 144/80 (BP Location: Left Arm, Patient Position: Sitting)   Pulse 62   Ht 5' 10"$  (1.778 m)   Wt 242 lb 12.8 oz (110.1 kg)   SpO2 97%   BMI 34.84 kg/m     Wt Readings from  Last 3 Encounters:  11/26/22 242 lb 12.8 oz (110.1 kg)  09/03/22 237 lb 12.8 oz (107.9 kg)  06/20/22 241 lb 3.2 oz (109.4 kg)     GEN:  Well nourished, well developed in no acute distress HEENT: Normal NECK: No JVD; No carotid bruits LYMPHATICS: No lymphadenopathy CARDIAC: RRR, no murmurs, no rubs, no gallops RESPIRATORY:  Clear to auscultation without rales, wheezing or rhonchi  ABDOMEN: Soft, non-tender, non-distended MUSCULOSKELETAL:  No edema; No deformity  SKIN: Warm and dry LOWER EXTREMITIES: no swelling NEUROLOGIC:  Alert and oriented x 3 PSYCHIATRIC:  Normal affect   ASSESSMENT:    1. Dilated cardiomyopathy (HCC)   2. Paroxysmal atrial fibrillation (HCC)   3. Dyslipidemia   4. Essential hypertension    PLAN:    In order of problems listed above:  History of cardiomyopathy last echocardiogram reviewed showed normal left ventricle ejection fraction.  Will continue present management. Paroxysmal atrial fibrillation he is anticoagulated because of CHA2DS2-VASc score being 3.  Will continue.  Denies having the palpitations.  His left atrium is mildly enlarged on the echocardiogram. Dyslipidemia I did review his K PN which show me LDL 131 this is from 05/11/2022 after this he was put back on Crestor.  She is planning to see his primary care physician on Monday.  He will have fasting lipid profile done. Long-term anticoagulation.  He is on Eliquis which I will continue.  He looks pale today I wanted to do his blood test today to check for anemia however he does have appoint with his primary care physician on Monday and he will have it done there. Essential hypertension blood pressure slightly elevated in the office today however he tells me it is always good at home we will continue monitoring   Medication Adjustments/Labs and Tests Ordered: Current medicines are reviewed at length with the patient today.  Concerns regarding medicines are outlined above.  No orders of the  defined types were placed in this encounter.  Medication changes: No orders of the defined types were placed in this encounter.   Signed, Park Liter, MD, [facility] 11/26/2022 9:38 AM    [place]

## 2022-12-03 ENCOUNTER — Encounter: Payer: Self-pay | Admitting: Internal Medicine

## 2022-12-03 ENCOUNTER — Ambulatory Visit: Payer: Medicare Other | Admitting: Internal Medicine

## 2022-12-03 VITALS — BP 128/78 | HR 70 | Temp 97.9°F | Resp 18 | Ht 70.0 in | Wt 240.2 lb

## 2022-12-03 DIAGNOSIS — I420 Dilated cardiomyopathy: Secondary | ICD-10-CM

## 2022-12-03 DIAGNOSIS — I10 Essential (primary) hypertension: Secondary | ICD-10-CM | POA: Diagnosis not present

## 2022-12-03 DIAGNOSIS — I480 Paroxysmal atrial fibrillation: Secondary | ICD-10-CM | POA: Diagnosis not present

## 2022-12-03 DIAGNOSIS — E785 Hyperlipidemia, unspecified: Secondary | ICD-10-CM | POA: Diagnosis not present

## 2022-12-03 DIAGNOSIS — R7303 Prediabetes: Secondary | ICD-10-CM

## 2022-12-03 NOTE — Progress Notes (Signed)
Office Visit  Subjective   Patient ID: David Braun   DOB: 04-15-1956   Age: 67 y.o.   MRN: [id number]   Chief Complaint Chief Complaint  Patient presents with   Follow-up    Follow up     History of Present Illness  The patient is a 67 year old Caucasian/White male who presents for a follow-up evaluation of hypertension. The patient has been checking his blood pressure at home. The patient's blood pressure has been stay below 130 and 70's DBP The patient's current medications include: amlodipine 10 mg oral tablet and carvedilol 25 mg oral tablet. The patient has been tolerating his medications well. The patient denies any visual changes, dizziness, lightheadness, chest pain, shortness of breath, orthopnea, and weakness/numbness. His last eye exam was cataract surgery in 11/2021 and 12/2021. He reports there have been no other symptoms noted.  This patient also has mild atrial fibrillation of years known duration and presents today for a status visit. Specifically denied complaints: palpitations, TIAs, orthopnea, edema, exertional dyspnea, and syncope. Interval history: no healthcare visits with other providers since last seen in this office . Interval studies: none. Anticoagulation status: he is on eliquis 5 mg twice a day. He is aware of the issues risks associated with no anticoagulation in the setting of A-fib.  This patient also has CHF of several years known duration and presents for a regular status visit. 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 His CHF has been in a compensated state on medications as noted in the medication list. He has no baseline symptoms of CHF. Specifically denied complaints: worsening orthopnea, worsening edema, and worsening exertional dyspnea. Interval history: no healthcare visits with other providers since last seen in this office .      No issues with gout and has not needed alopurinol for years per pt  He occasionally use  Trazodone for insomnia.   Past Medical History Past Medical History:  Diagnosis Date   Atrial fibrillation with RVR ([place]) 01/05/2019   Chads score of 2   Cardiomyopathy ([place]) 01/12/2019   Ejection fraction 35 to 40% in spring 2020   CHF (congestive heart failure), NYHA class III, acute on chronic, combined ([place]) 10/26/2018   Chronic anticoagulation    Chronic systolic (congestive) heart failure ([place]) 10/26/2018   Ejection fraction < 50%    Essential hypertension 10/28/2018   History of cardioversion    Paroxysmal atrial flutter ([place]) 01/06/2019   Chads 2 Vascor equals 2   Persistent atrial fibrillation (HCC) 02/06/2019     Allergies Allergies  Allergen Reactions   Codeine Itching     Review of Systems Review of Systems  Constitutional: Negative.   HENT: Negative.    Respiratory: Negative.    Cardiovascular: Negative.   Gastrointestinal: Negative.   Neurological: Negative.        Objective:    Vitals BP 128/78 (BP Location: Left Arm, Patient Position: Sitting, Cuff Size: Normal)   Pulse 70   Temp 97.9 F (36.6 C)   Resp 18   Ht '5\' 10"'$  (1.778 m)   Wt 240 lb 4 oz (109 kg)   SpO2 95%   BMI 34.47 kg/m    Physical Examination Physical Exam Constitutional:      Appearance: Normal appearance. He is normal weight.  HENT:     Head: Normocephalic and atraumatic.  Eyes:     Extraocular Movements: Extraocular movements intact.     Pupils: Pupils are equal, round, and reactive  to light.  Cardiovascular:     Rate and Rhythm: Normal rate and regular rhythm.     Heart sounds: Normal heart sounds.  Pulmonary:     Effort: Pulmonary effort is normal.     Breath sounds: Normal breath sounds.  Abdominal:     General: Bowel sounds are normal.     Palpations: Abdomen is soft.  Neurological:     General: No focal deficit present.     Mental Status: He is alert and oriented to person, place, and time.        Assessment & Plan:   Paroxysmal atrial fibrillation  (HCC) Heart rate is controlled. He is on eliquis.  Essential hypertension stable  Cardiomyopathy ([employer]) His EF is 40%-45%. I will add entersto twice a day.  Dyslipidemia His LDL was 131.     Return in about 3 months (around 03/03/2023).   Garwin Brothers, MD

## 2022-12-03 NOTE — Assessment & Plan Note (Signed)
stable °

## 2022-12-03 NOTE — Assessment & Plan Note (Signed)
His EF is 40%-45%. I will add entersto twice a day.

## 2022-12-03 NOTE — Assessment & Plan Note (Signed)
His LDL was 131.

## 2022-12-03 NOTE — Assessment & Plan Note (Signed)
Will do HbA1c today

## 2022-12-03 NOTE — Assessment & Plan Note (Signed)
Heart rate is controlled. He is on eliquis.

## 2022-12-04 LAB — LIPID PANEL
Chol/HDL Ratio: 2.4 ratio (ref 0.0–5.0)
Cholesterol, Total: 118 mg/dL (ref 100–199)
HDL: 50 mg/dL (ref >39)
LDL Chol Calc (NIH): 43 mg/dL (ref 0–99)
Triglycerides: 144 mg/dL (ref 0–149)
VLDL Cholesterol Cal: 25 mg/dL (ref 5–40)

## 2022-12-04 LAB — CMP14 + ANION GAP
ALT: 39 IU/L (ref 0–44)
AST: 48 IU/L — ABNORMAL HIGH (ref 0–40)
Albumin/Globulin Ratio: 1.5 (ref 1.2–2.2)
Albumin: 4.6 g/dL (ref 3.9–4.9)
Alkaline Phosphatase: 120 IU/L (ref 44–121)
Anion Gap: 18 mmol/L (ref 10.0–18.0)
BUN/Creatinine Ratio: 12 (ref 10–24)
BUN: 18 mg/dL (ref 8–27)
Bilirubin Total: 0.6 mg/dL (ref 0.0–1.2)
CO2: 21 mmol/L (ref 20–29)
Calcium: 9.5 mg/dL (ref 8.6–10.2)
Chloride: 101 mmol/L (ref 96–106)
Creatinine, Ser: 1.45 mg/dL — ABNORMAL HIGH (ref 0.76–1.27)
Globulin, Total: 3.1 g/dL (ref 1.5–4.5)
Glucose: 151 mg/dL — ABNORMAL HIGH (ref 70–99)
Potassium: 4.4 mmol/L (ref 3.5–5.2)
Sodium: 140 mmol/L (ref 134–144)
Total Protein: 7.7 g/dL (ref 6.0–8.5)
eGFR: 53 mL/min/{1.73_m2} — ABNORMAL LOW (ref >59)

## 2022-12-04 LAB — HEMOGLOBIN A1C
Est. average glucose Bld gHb Est-mCnc: 108 mg/dL
Hgb A1c MFr Bld: 5.4 % (ref 4.8–5.6)

## 2022-12-06 NOTE — Assessment & Plan Note (Signed)
I will continue with current medication and monitor liver function.

## 2022-12-06 NOTE — Assessment & Plan Note (Signed)
We will repeat lipid panel.

## 2022-12-06 NOTE — Assessment & Plan Note (Signed)
Well- controlled on current medication 

## 2022-12-06 NOTE — Assessment & Plan Note (Signed)
Rate is controlled.  He is on Eliquis for anticoagulation.

## 2023-02-25 ENCOUNTER — Encounter: Payer: Self-pay | Admitting: Internal Medicine

## 2023-02-25 ENCOUNTER — Ambulatory Visit: Payer: Medicare Other | Admitting: Internal Medicine

## 2023-02-25 VITALS — BP 140/80 | HR 62 | Temp 97.4°F | Resp 18 | Ht 70.0 in | Wt 239.2 lb

## 2023-02-25 DIAGNOSIS — I5022 Chronic systolic (congestive) heart failure: Secondary | ICD-10-CM

## 2023-02-25 DIAGNOSIS — I4819 Other persistent atrial fibrillation: Secondary | ICD-10-CM

## 2023-02-25 DIAGNOSIS — I10 Essential (primary) hypertension: Secondary | ICD-10-CM | POA: Diagnosis not present

## 2023-02-25 DIAGNOSIS — R7303 Prediabetes: Secondary | ICD-10-CM

## 2023-02-25 DIAGNOSIS — E785 Hyperlipidemia, unspecified: Secondary | ICD-10-CM | POA: Diagnosis not present

## 2023-02-25 NOTE — Assessment & Plan Note (Signed)
His blood pressure is slightly high today but has been good at home.

## 2023-02-25 NOTE — Assessment & Plan Note (Signed)
His heart rate is controlled and he is on eliquis 

## 2023-02-25 NOTE — Addendum Note (Signed)
Addended by: Kijana Cromie on: 02/25/2023 02:42 PM   Modules accepted: Orders

## 2023-02-25 NOTE — Assessment & Plan Note (Signed)
He stay tired most of the time,  His EF was 40-45%. He will take 2 Co-enzyme Q 10 and may need sleep study as well.

## 2023-02-25 NOTE — Progress Notes (Signed)
Office Visit  Subjective   Patient ID: David Braun   DOB: 1956/09/24   Age: 67 y.o.   MRN: [id number]   Chief Complaint Chief Complaint  Patient presents with   Follow-up    Paroxysmal atrial fibrillation     History of Present Illness  The patient is a 67 year old Caucasian/White male who presents for a follow-up evaluation of hypertension. The patient has been checking his blood pressure at home. The patient's blood pressure has been stay below 130 and 70's DBP The patient's current medications include: amlodipine 10 mg oral tablet and carvedilol 25 mg oral tablet. The patient has been tolerating his medications well. The patient denies any visual changes, dizziness, lightheadness, chest pain, shortness of breath, orthopnea, and weakness/numbness. His last eye exam was cataract surgery in 11/2021 and 12/2021. He reports there have been no other symptoms noted.  This patient also has mild atrial fibrillation of years known duration and presents today for a status visit. Specifically denied complaints: palpitations, TIAs, orthopnea, edema, exertional dyspnea, and syncope. Interval history: no healthcare visits with other providers since last seen in this office . Interval studies: none. Anticoagulation status: he is on eliquis 5 mg twice a day. He is aware of the issues risks associated with no anticoagulation in the setting of A-fib.  This patient also has CHF of several years known duration and presents for a regular status visit. 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 His CHF has been in a compensated state on medications as noted in the medication list. He has no baseline symptoms of CHF. Specifically denied complaints: worsening orthopnea, worsening edema, and worsening exertional dyspnea. Interval history: no healthcare visits with other providers since last seen in this office .       No issues with gout and has not needed alopurinol for years per pt   He  occasionally use Trazodone for insomnia.   Past Medical History Past Medical History:  Diagnosis Date   Atrial fibrillation with RVR (HCC) 01/05/2019   Chads score of 2   Cardiomyopathy (HCC) 01/12/2019   Ejection fraction 35 to 40% in spring 2020   CHF (congestive heart failure), NYHA class III, acute on chronic, combined (HCC) 10/26/2018   Chronic anticoagulation    Chronic systolic (congestive) heart failure (HCC) 10/26/2018   Ejection fraction < 50%    Essential hypertension 10/28/2018   History of cardioversion    Paroxysmal atrial flutter (HCC) 01/06/2019   Chads 2 Vascor equals 2   Persistent atrial fibrillation (HCC) 02/06/2019     Allergies Allergies  Allergen Reactions   Codeine Itching     Review of Systems Review of Systems  Constitutional:  Positive for malaise/fatigue.  HENT: Negative.    Respiratory: Negative.    Cardiovascular: Negative.   Gastrointestinal: Negative.   Neurological: Negative.        Objective:    Vitals BP (!) 140/80 (BP Location: Left Arm, Patient Position: Sitting, Cuff Size: Normal)   Pulse 62   Temp (!) 97.4 F (36.3 C)   Resp 18   Ht 5\' 10"  (1.778 m)   Wt 239 lb 4 oz (108.5 kg)   SpO2 95%   BMI 34.33 kg/m    Physical Examination Physical Exam Constitutional:      Appearance: Normal appearance. He is obese.  HENT:     Head: Normocephalic and atraumatic.  Eyes:     Extraocular Movements: Extraocular movements intact.     Pupils:  Pupils are equal, round, and reactive to light.  Cardiovascular:     Rate and Rhythm: Normal rate and regular rhythm.     Heart sounds: Normal heart sounds.  Pulmonary:     Effort: Pulmonary effort is normal.     Breath sounds: Normal breath sounds.  Abdominal:     General: Bowel sounds are normal.     Palpations: Abdomen is soft.  Neurological:     General: No focal deficit present.     Mental Status: He is alert and oriented to person, place, and time.        Assessment & Plan:    Persistent atrial fibrillation His heart rate is controlled and he is on eliquis.   Essential hypertension His blood pressure is slightly high today but has been good at home.   Chronic systolic (congestive) heart failure (HCC) He stay tired most of the time,  His EF was 40-45%. He will take 2 Co-enzyme Q 10 and may need sleep study as well.   Dyslipidemia He is on rosuvastatin 10 mg daily    Return in about 3 months (around 05/28/2023).   Eloisa Northern, MD

## 2023-02-25 NOTE — Assessment & Plan Note (Signed)
He is on rosuvastatin 10 mg daily. 

## 2023-02-26 LAB — CBC WITH DIFFERENTIAL/PLATELET
Basophils Absolute: 0.1 10*3/uL (ref 0.0–0.2)
Basos: 1 %
EOS (ABSOLUTE): 0.4 10*3/uL (ref 0.0–0.4)
Eos: 4 %
Hematocrit: 33.4 % — ABNORMAL LOW (ref 37.5–51.0)
Hemoglobin: 10.3 g/dL — ABNORMAL LOW (ref 13.0–17.7)
Immature Grans (Abs): 0 10*3/uL (ref 0.0–0.1)
Immature Granulocytes: 0 %
Lymphocytes Absolute: 1.1 10*3/uL (ref 0.7–3.1)
Lymphs: 12 %
MCH: 27.1 pg (ref 26.6–33.0)
MCHC: 30.8 g/dL — ABNORMAL LOW (ref 31.5–35.7)
MCV: 88 fL (ref 79–97)
Monocytes Absolute: 1 10*3/uL — ABNORMAL HIGH (ref 0.1–0.9)
Monocytes: 11 %
Neutrophils Absolute: 6.5 10*3/uL (ref 1.4–7.0)
Neutrophils: 72 %
Platelets: 166 10*3/uL (ref 150–450)
RBC: 3.8 x10E6/uL — ABNORMAL LOW (ref 4.14–5.80)
RDW: 15.2 % (ref 11.6–15.4)
WBC: 9.1 10*3/uL (ref 3.4–10.8)

## 2023-03-05 ENCOUNTER — Telehealth: Payer: Self-pay | Admitting: Internal Medicine

## 2023-03-05 NOTE — Telephone Encounter (Signed)
I have spoken with patient, he has anemia, he will come to our office for anemia panel and three day stool occult blood card

## 2023-03-11 ENCOUNTER — Ambulatory Visit: Payer: Medicare Other

## 2023-03-11 DIAGNOSIS — R945 Abnormal results of liver function studies: Secondary | ICD-10-CM

## 2023-03-12 LAB — ANEMIA PANEL
Ferritin: 28 ng/mL — ABNORMAL LOW (ref 30–400)
Hematocrit: 32.9 % — ABNORMAL LOW (ref 37.5–51.0)
Iron Saturation: 12 % — ABNORMAL LOW (ref 15–55)
Iron: 51 ug/dL (ref 38–169)
Retic Ct Pct: 2.7 % — ABNORMAL HIGH (ref 0.6–2.6)
Total Iron Binding Capacity: 411 ug/dL (ref 250–450)
UIBC: 360 ug/dL — ABNORMAL HIGH (ref 111–343)
Vitamin B-12: 860 pg/mL (ref 232–1245)

## 2023-03-15 ENCOUNTER — Ambulatory Visit: Payer: Medicare Other

## 2023-03-15 DIAGNOSIS — D649 Anemia, unspecified: Secondary | ICD-10-CM

## 2023-03-15 LAB — HEMOCCULT GUIAC POC 1CARD (OFFICE)
Card #2 Fecal Occult Blod, POC: POSITIVE
Card #3 Fecal Occult Blood, POC: POSITIVE
Fecal Occult Blood, POC: NEGATIVE — AB

## 2023-03-15 NOTE — Progress Notes (Signed)
[place] occult cards x3

## 2023-03-18 NOTE — Addendum Note (Signed)
Addended byEloisa Northern on: 03/18/2023 09:15 AM   Modules accepted: Orders

## 2023-03-18 NOTE — Progress Notes (Signed)
He has iron deficiency anemia with occult blood positive, I will refer him to see gastroenterologist for evaluation, I have suggested him to take one iron tablet and continue with eliquis as he is asymptomatic. If he has frank blood then he will go to ED.

## 2023-03-29 ENCOUNTER — Other Ambulatory Visit: Payer: Self-pay

## 2023-03-29 MED ORDER — AMLODIPINE BESYLATE 10 MG PO TABS: 10.0000 mg | ORAL_TABLET | Freq: Every day | ORAL | 1 refills | Status: DC

## 2023-04-29 DIAGNOSIS — I130 Hypertensive heart and chronic kidney disease with heart failure and stage 1 through stage 4 chronic kidney disease, or unspecified chronic kidney disease: Secondary | ICD-10-CM | POA: Diagnosis not present

## 2023-04-29 DIAGNOSIS — M1990 Unspecified osteoarthritis, unspecified site: Secondary | ICD-10-CM | POA: Diagnosis not present

## 2023-04-29 DIAGNOSIS — I951 Orthostatic hypotension: Secondary | ICD-10-CM | POA: Diagnosis not present

## 2023-04-29 DIAGNOSIS — F419 Anxiety disorder, unspecified: Secondary | ICD-10-CM | POA: Diagnosis not present

## 2023-04-29 DIAGNOSIS — N1831 Chronic kidney disease, stage 3a: Secondary | ICD-10-CM | POA: Diagnosis not present

## 2023-04-29 DIAGNOSIS — I509 Heart failure, unspecified: Secondary | ICD-10-CM | POA: Diagnosis not present

## 2023-04-29 DIAGNOSIS — D6869 Other thrombophilia: Secondary | ICD-10-CM | POA: Diagnosis not present

## 2023-04-29 DIAGNOSIS — Z8249 Family history of ischemic heart disease and other diseases of the circulatory system: Secondary | ICD-10-CM | POA: Diagnosis not present

## 2023-04-29 DIAGNOSIS — I429 Cardiomyopathy, unspecified: Secondary | ICD-10-CM | POA: Diagnosis not present

## 2023-04-29 DIAGNOSIS — E785 Hyperlipidemia, unspecified: Secondary | ICD-10-CM | POA: Diagnosis not present

## 2023-04-29 DIAGNOSIS — G4700 Insomnia, unspecified: Secondary | ICD-10-CM | POA: Diagnosis not present

## 2023-05-15 DIAGNOSIS — R5383 Other fatigue: Secondary | ICD-10-CM | POA: Diagnosis not present

## 2023-05-15 DIAGNOSIS — Z86010 Personal history of colonic polyps: Secondary | ICD-10-CM | POA: Diagnosis not present

## 2023-05-15 DIAGNOSIS — K921 Melena: Secondary | ICD-10-CM | POA: Diagnosis not present

## 2023-05-15 DIAGNOSIS — Z7901 Long term (current) use of anticoagulants: Secondary | ICD-10-CM | POA: Diagnosis not present

## 2023-05-15 DIAGNOSIS — R7401 Elevation of levels of liver transaminase levels: Secondary | ICD-10-CM | POA: Diagnosis not present

## 2023-05-15 DIAGNOSIS — Z1211 Encounter for screening for malignant neoplasm of colon: Secondary | ICD-10-CM | POA: Diagnosis not present

## 2023-05-15 DIAGNOSIS — Z5181 Encounter for therapeutic drug level monitoring: Secondary | ICD-10-CM | POA: Diagnosis not present

## 2023-05-15 DIAGNOSIS — Z8619 Personal history of other infectious and parasitic diseases: Secondary | ICD-10-CM | POA: Diagnosis not present

## 2023-05-15 DIAGNOSIS — D509 Iron deficiency anemia, unspecified: Secondary | ICD-10-CM | POA: Diagnosis not present

## 2023-05-27 ENCOUNTER — Ambulatory Visit: Payer: Medicare Other | Admitting: Internal Medicine

## 2023-06-04 DIAGNOSIS — R7401 Elevation of levels of liver transaminase levels: Secondary | ICD-10-CM | POA: Diagnosis not present

## 2023-06-04 DIAGNOSIS — Z8619 Personal history of other infectious and parasitic diseases: Secondary | ICD-10-CM | POA: Diagnosis not present

## 2023-06-04 DIAGNOSIS — R160 Hepatomegaly, not elsewhere classified: Secondary | ICD-10-CM | POA: Diagnosis not present

## 2023-06-04 DIAGNOSIS — K760 Fatty (change of) liver, not elsewhere classified: Secondary | ICD-10-CM | POA: Diagnosis not present

## 2023-06-04 DIAGNOSIS — K8020 Calculus of gallbladder without cholecystitis without obstruction: Secondary | ICD-10-CM | POA: Diagnosis not present

## 2023-06-04 DIAGNOSIS — N281 Cyst of kidney, acquired: Secondary | ICD-10-CM | POA: Diagnosis not present

## 2023-06-07 ENCOUNTER — Encounter: Payer: Self-pay | Admitting: Internal Medicine

## 2023-06-07 ENCOUNTER — Ambulatory Visit: Payer: Medicare Other | Admitting: Internal Medicine

## 2023-06-07 VITALS — BP 124/78 | HR 61 | Temp 97.5°F | Resp 18 | Ht 70.0 in | Wt 234.1 lb

## 2023-06-07 DIAGNOSIS — K7400 Hepatic fibrosis, unspecified: Secondary | ICD-10-CM | POA: Diagnosis not present

## 2023-06-07 DIAGNOSIS — E785 Hyperlipidemia, unspecified: Secondary | ICD-10-CM

## 2023-06-07 DIAGNOSIS — D500 Iron deficiency anemia secondary to blood loss (chronic): Secondary | ICD-10-CM | POA: Diagnosis not present

## 2023-06-07 DIAGNOSIS — K760 Fatty (change of) liver, not elsewhere classified: Secondary | ICD-10-CM | POA: Diagnosis not present

## 2023-06-07 DIAGNOSIS — I480 Paroxysmal atrial fibrillation: Secondary | ICD-10-CM | POA: Diagnosis not present

## 2023-06-07 DIAGNOSIS — D509 Iron deficiency anemia, unspecified: Secondary | ICD-10-CM | POA: Insufficient documentation

## 2023-06-07 DIAGNOSIS — Z8619 Personal history of other infectious and parasitic diseases: Secondary | ICD-10-CM | POA: Diagnosis not present

## 2023-06-07 DIAGNOSIS — I10 Essential (primary) hypertension: Secondary | ICD-10-CM

## 2023-06-07 NOTE — Progress Notes (Signed)
Office Visit  Subjective   Patient ID: David Braun   DOB: Oct 24, 1955   Age: 67 y.o.   MRN: [id number]   Chief Complaint Chief Complaint  Patient presents with   Follow-up    Borderline type 2 diabetes , primary hypertension     History of Present Illness The patient is a 67 year old Caucasian/White male who presents for a follow-up. He has hypertension. The patient has been checking his blood pressure at home. The patient's blood pressure has been stay below 130 and 70's DBP The patient's current medications include: amlodipine 10 mg oral tablet and carvedilol 25 mg oral tablet. The patient has been tolerating his medications well. The patient denies any visual changes, dizziness, lightheadness, chest pain, shortness of breath, orthopnea, and weakness/numbness. His last eye exam was cataract surgery in 11/2021 and 12/2021. He reports there have been no other symptoms noted.  This patient also has mild atrial fibrillation of years known duration and presents today for a status visit. Specifically denied complaints: palpitations, TIAs, orthopnea, edema, exertional dyspnea, and syncope. Interval history: no healthcare visits with other providers since last seen in this office . Interval studies: none. Anticoagulation status: he is on eliquis 5 mg twice a day. He is aware of the issues risks associated with no anticoagulation in the setting of A-fib.  This patient also has CHF of several years known duration and presents for a regular status visit. 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 His CHF has been in a compensated state on medications as noted in the medication list. He has no baseline symptoms of CHF. Specifically denied complaints: worsening orthopnea, worsening edema, and worsening exertional dyspnea. Interval history: no healthcare visits with other providers since last seen in this office .       He is going to see GI today for evaluation of iron deficiency  anemia and they will wait from cardiologist approval when he can stop eliquis. No issues with gout and has not needed alopurinol for years.    He occasionally use Trazodone for insomnia.    Past Medical History Past Medical History:  Diagnosis Date   Atrial fibrillation with RVR (HCC) 01/05/2019   Chads score of 2   Cardiomyopathy (HCC) 01/12/2019   Ejection fraction 35 to 40% in spring 2020   CHF (congestive heart failure), NYHA class III, acute on chronic, combined (HCC) 10/26/2018   Chronic anticoagulation    Chronic systolic (congestive) heart failure (HCC) 10/26/2018   Ejection fraction < 50%    Essential hypertension 10/28/2018   History of cardioversion    Paroxysmal atrial flutter (HCC) 01/06/2019   Chads 2 Vascor equals 2   Persistent atrial fibrillation (HCC) 02/06/2019     Allergies Allergies  Allergen Reactions   Codeine Itching     Review of Systems Review of Systems  Constitutional: Negative.   HENT: Negative.    Respiratory: Negative.    Cardiovascular: Negative.   Gastrointestinal: Negative.   Neurological: Negative.        Objective:    Vitals BP 124/78 (BP Location: Left Arm, Patient Position: Sitting, Cuff Size: Normal)   Pulse 61   Temp (!) 97.5 F (36.4 C)   Resp 18   Ht 5\' 10"  (1.778 m)   Wt 234 lb 2 oz (106.2 kg)   SpO2 94%   BMI 33.59 kg/m    Physical Examination Physical Exam Constitutional:      Appearance: Normal appearance. He is obese.  HENT:     Head: Normocephalic and atraumatic.  Eyes:     Extraocular Movements: Extraocular movements intact.     Pupils: Pupils are equal, round, and reactive to light.  Cardiovascular:     Rate and Rhythm: Normal rate and regular rhythm.     Heart sounds: Normal heart sounds.  Pulmonary:     Effort: Pulmonary effort is normal.     Breath sounds: Normal breath sounds.  Abdominal:     General: Bowel sounds are normal.     Palpations: Abdomen is soft.  Neurological:     General: No  focal deficit present.     Mental Status: He is alert and oriented to person, place, and time.        Assessment & Plan:   Iron deficiency anemia He is going to see GI this afternoon.   Dyslipidemia He is on rosuvastatin 10 mg daily  Paroxysmal atrial fibrillation (HCC) His heart rate is controlled and he is on eliquis.   Essential hypertension controlled    Return in about 3 months (around 09/07/2023).   Eloisa Northern, MD

## 2023-06-07 NOTE — Assessment & Plan Note (Signed)
His heart rate is controlled and he is on eliquis

## 2023-06-07 NOTE — Assessment & Plan Note (Signed)
He is going to see GI this afternoon.

## 2023-06-07 NOTE — Assessment & Plan Note (Signed)
He is on rosuvastatin 10 mg daily. 

## 2023-06-07 NOTE — Assessment & Plan Note (Signed)
controlled 

## 2023-07-05 ENCOUNTER — Other Ambulatory Visit: Payer: Self-pay | Admitting: Cardiology

## 2023-07-08 ENCOUNTER — Telehealth: Payer: Self-pay | Admitting: Cardiology

## 2023-07-08 NOTE — Telephone Encounter (Signed)
Pharmacy please advise on holding Eliquis prior to endoscopy/colonoscopy scheduled for 09/16/2023. Thank you.

## 2023-07-08 NOTE — Telephone Encounter (Signed)
   Pre-operative Risk Assessment    Patient Name: David Braun  DOB: 1956/04/12 MRN: [id number]      Request for Surgical Clearance    Procedure:   upper Endoscopy and colonoscopy   Date of Surgery:  Clearance 09/15/24                                 Surgeon:  Dr. Antony Blackbird  Surgeon's Group or Practice Name:  [facility]  Phone number:  [phone] Fax number:  [phone]   Type of Clearance Requested:   - Medical  - Pharmacy:  Hold Apixaban (Eliquis) 48 hrs   Type of Anesthesia:  MAC   Additional requests/questions:      SignedFilomena Jungling   07/08/2023, 4:07 PM  .

## 2023-07-09 ENCOUNTER — Telehealth: Payer: Self-pay

## 2023-07-09 NOTE — Telephone Encounter (Signed)
  Patient Consent for Virtual Visit         David Braun has provided verbal consent on 07/09/2023 for a virtual visit (video or telephone).   CONSENT FOR VIRTUAL VISIT FOR:  David Braun  By participating in this virtual visit I agree to the following:  I hereby voluntarily request, consent and authorize [facility] and its employed or contracted physicians, physician assistants, nurse practitioners or other licensed health care professionals (the Practitioner), to provide me with telemedicine health care services (the "Services") as deemed necessary by the treating Practitioner. I acknowledge and consent to receive the Services by the Practitioner via telemedicine. I understand that the telemedicine visit will involve communicating with the Practitioner through live audiovisual communication technology and the disclosure of certain medical information by electronic transmission. I acknowledge that I have been given the opportunity to request an in-person assessment or other available alternative prior to the telemedicine visit and am voluntarily participating in the telemedicine visit.  I understand that I have the right to withhold or withdraw my consent to the use of telemedicine in the course of my care at any time, without affecting my right to future care or treatment, and that the Practitioner or I may terminate the telemedicine visit at any time. I understand that I have the right to inspect all information obtained and/or recorded in the course of the telemedicine visit and may receive copies of available information for a reasonable fee.  I understand that some of the potential risks of receiving the Services via telemedicine include:  Delay or interruption in medical evaluation due to technological equipment failure or disruption; Information transmitted may not be sufficient (e.g. poor resolution of images) to allow for appropriate medical decision making by the Practitioner;  and/or  In rare instances, security protocols could fail, causing a breach of personal health information.  Furthermore, I acknowledge that it is my responsibility to provide information about my medical history, conditions and care that is complete and accurate to the best of my ability. I acknowledge that Practitioner's advice, recommendations, and/or decision may be based on factors not within their control, such as incomplete or inaccurate data provided by me or distortions of diagnostic images or specimens that may result from electronic transmissions. I understand that the practice of medicine is not an exact science and that Practitioner makes no warranties or guarantees regarding treatment outcomes. I acknowledge that a copy of this consent can be made available to me via my patient portal [facility] MyChart), or I can request a printed copy by calling the office of [facility].    I understand that my insurance will be billed for this visit.   I have read or had this consent read to me. I understand the contents of this consent, which adequately explains the benefits and risks of the Services being provided via telemedicine.  I have been provided ample opportunity to ask questions regarding this consent and the Services and have had my questions answered to my satisfaction. I give my informed consent for the services to be provided through the use of telemedicine in my medical care

## 2023-07-09 NOTE — Telephone Encounter (Signed)
   Name: David Braun  DOB: Dec 02, 1955  MRN: [id number]  Primary Cardiologist: None   Preoperative team, please contact this patient and set up a phone call appointment for further preoperative risk assessment. Please obtain consent and complete medication review. Thank you for your help.  I confirm that guidance regarding antiplatelet and oral anticoagulation therapy has been completed and, if necessary, noted below.  Per office protocol, patient can hold Eliquis for 2 days prior to procedure as requested.   I also confirmed the patient resides in the state of [place]. As per [facility] Medical Board telemedicine laws, the patient must reside in the state in which the provider is licensed.   Napoleon Form, Leodis Rains, NP 07/09/2023, 7:58 AM [facility]

## 2023-07-09 NOTE — Telephone Encounter (Signed)
Patient with diagnosis of afib on Eliquis for anticoagulation.    Procedure: endoscopy/colonoscopy Date of procedure: 09/16/23  CHA2DS2-VASc Score = 3  This indicates a 3.2% annual risk of stroke. The patient's score is based upon: CHF History: 1 HTN History: 1 Diabetes History: 0 Stroke History: 0 Vascular Disease History: 0 Age Score: 1 Gender Score: 0  CrCl 77mL/min using adjusted body weight Platelet count 119K  Per office protocol, patient can hold Eliquis for 2 days prior to procedure as requested.    **This guidance is not considered finalized until pre-operative APP has relayed final recommendations.**

## 2023-07-09 NOTE — Telephone Encounter (Signed)
Patient agreeable with virtual appointment. Med list reviewed and consent given.

## 2023-07-19 DIAGNOSIS — K7400 Hepatic fibrosis, unspecified: Secondary | ICD-10-CM | POA: Diagnosis not present

## 2023-07-19 DIAGNOSIS — K7689 Other specified diseases of liver: Secondary | ICD-10-CM | POA: Diagnosis not present

## 2023-07-19 DIAGNOSIS — K760 Fatty (change of) liver, not elsewhere classified: Secondary | ICD-10-CM | POA: Diagnosis not present

## 2023-07-19 DIAGNOSIS — K7469 Other cirrhosis of liver: Secondary | ICD-10-CM | POA: Diagnosis not present

## 2023-07-19 DIAGNOSIS — K7589 Other specified inflammatory liver diseases: Secondary | ICD-10-CM | POA: Diagnosis not present

## 2023-07-31 ENCOUNTER — Other Ambulatory Visit: Payer: Self-pay | Admitting: Cardiology

## 2023-08-03 ENCOUNTER — Other Ambulatory Visit: Payer: Self-pay | Admitting: Cardiology

## 2023-08-05 ENCOUNTER — Other Ambulatory Visit: Payer: Self-pay | Admitting: Internal Medicine

## 2023-08-05 MED ORDER — FUROSEMIDE 20 MG PO TABS: 20.0000 mg | ORAL_TABLET | ORAL | 1 refills | Status: AC | PRN

## 2023-08-05 MED ORDER — CARVEDILOL 25 MG PO TABS: 25.0000 mg | ORAL_TABLET | Freq: Two times a day (BID) | ORAL | 1 refills | Status: AC

## 2023-08-05 NOTE — Telephone Encounter (Signed)
Prescription refill request for Eliquis received. Indication: PAF Last office visit: 11/26/22  Kandyce Rud MD Scr: 1.77 on 05/15/23  Epic Age: 67 Weight: 110.1kg  Based on above findings Eliquis 5mg  twice daily is the appropriate dose.  Refill approved.

## 2023-08-14 ENCOUNTER — Telehealth: Payer: Self-pay | Admitting: Cardiology

## 2023-08-14 NOTE — Telephone Encounter (Signed)
FYI-Pt had to cx pre-op tele visit because he is out of network

## 2023-08-14 NOTE — Telephone Encounter (Signed)
Delane Ginger, Beverly33 minutes ago (10:40 AM)   BG FYI-Pt had to cx pre-op tele visit because he is out of network      Note   Jevante, Holl[place]augh "Tim" [phone]  Andreas Blower    I will update all parties involved pt has cancelled tele appt.

## 2023-08-16 ENCOUNTER — Ambulatory Visit: Payer: Medicare Other

## 2023-08-19 DIAGNOSIS — R5383 Other fatigue: Secondary | ICD-10-CM | POA: Diagnosis not present

## 2023-08-19 DIAGNOSIS — D509 Iron deficiency anemia, unspecified: Secondary | ICD-10-CM | POA: Diagnosis not present

## 2023-08-19 DIAGNOSIS — K7460 Unspecified cirrhosis of liver: Secondary | ICD-10-CM | POA: Diagnosis not present

## 2023-08-22 DIAGNOSIS — I480 Paroxysmal atrial fibrillation: Secondary | ICD-10-CM | POA: Diagnosis not present

## 2023-08-22 DIAGNOSIS — Z01810 Encounter for preprocedural cardiovascular examination: Secondary | ICD-10-CM | POA: Diagnosis not present

## 2023-08-22 DIAGNOSIS — I10 Essential (primary) hypertension: Secondary | ICD-10-CM | POA: Diagnosis not present

## 2023-08-22 DIAGNOSIS — Z8679 Personal history of other diseases of the circulatory system: Secondary | ICD-10-CM | POA: Diagnosis not present

## 2023-08-23 DIAGNOSIS — R001 Bradycardia, unspecified: Secondary | ICD-10-CM | POA: Diagnosis not present

## 2023-08-23 DIAGNOSIS — I498 Other specified cardiac arrhythmias: Secondary | ICD-10-CM | POA: Diagnosis not present

## 2023-08-27 DIAGNOSIS — D509 Iron deficiency anemia, unspecified: Secondary | ICD-10-CM | POA: Diagnosis not present

## 2023-08-27 DIAGNOSIS — R5383 Other fatigue: Secondary | ICD-10-CM | POA: Diagnosis not present

## 2023-09-03 DIAGNOSIS — R5383 Other fatigue: Secondary | ICD-10-CM | POA: Diagnosis not present

## 2023-09-03 DIAGNOSIS — D509 Iron deficiency anemia, unspecified: Secondary | ICD-10-CM | POA: Diagnosis not present

## 2023-09-09 ENCOUNTER — Encounter: Payer: Medicare Other | Admitting: Internal Medicine

## 2023-09-10 DIAGNOSIS — D509 Iron deficiency anemia, unspecified: Secondary | ICD-10-CM | POA: Diagnosis not present

## 2023-09-10 DIAGNOSIS — R5383 Other fatigue: Secondary | ICD-10-CM | POA: Diagnosis not present

## 2023-09-16 DIAGNOSIS — D509 Iron deficiency anemia, unspecified: Secondary | ICD-10-CM | POA: Diagnosis not present

## 2023-09-16 DIAGNOSIS — D123 Benign neoplasm of transverse colon: Secondary | ICD-10-CM | POA: Diagnosis not present

## 2023-09-16 DIAGNOSIS — Z7901 Long term (current) use of anticoagulants: Secondary | ICD-10-CM | POA: Diagnosis not present

## 2023-09-16 DIAGNOSIS — I483 Typical atrial flutter: Secondary | ICD-10-CM | POA: Diagnosis not present

## 2023-09-16 DIAGNOSIS — K921 Melena: Secondary | ICD-10-CM | POA: Diagnosis not present

## 2023-09-16 DIAGNOSIS — K648 Other hemorrhoids: Secondary | ICD-10-CM | POA: Diagnosis not present

## 2023-09-16 DIAGNOSIS — I10 Essential (primary) hypertension: Secondary | ICD-10-CM | POA: Diagnosis not present

## 2023-09-16 DIAGNOSIS — K3189 Other diseases of stomach and duodenum: Secondary | ICD-10-CM | POA: Diagnosis not present

## 2023-09-16 DIAGNOSIS — R195 Other fecal abnormalities: Secondary | ICD-10-CM | POA: Diagnosis not present

## 2023-09-16 DIAGNOSIS — Z1211 Encounter for screening for malignant neoplasm of colon: Secondary | ICD-10-CM | POA: Diagnosis not present

## 2023-09-16 DIAGNOSIS — D122 Benign neoplasm of ascending colon: Secondary | ICD-10-CM | POA: Diagnosis not present

## 2023-09-16 DIAGNOSIS — Z860101 Personal history of adenomatous and serrated colon polyps: Secondary | ICD-10-CM | POA: Diagnosis not present

## 2023-09-16 DIAGNOSIS — K319 Disease of stomach and duodenum, unspecified: Secondary | ICD-10-CM | POA: Diagnosis not present

## 2023-09-16 DIAGNOSIS — R5383 Other fatigue: Secondary | ICD-10-CM | POA: Diagnosis not present

## 2023-09-16 DIAGNOSIS — I482 Chronic atrial fibrillation, unspecified: Secondary | ICD-10-CM | POA: Diagnosis not present

## 2023-10-03 ENCOUNTER — Other Ambulatory Visit: Payer: Self-pay | Admitting: Internal Medicine

## 2023-12-02 DIAGNOSIS — R197 Diarrhea, unspecified: Secondary | ICD-10-CM | POA: Diagnosis not present

## 2023-12-02 DIAGNOSIS — R109 Unspecified abdominal pain: Secondary | ICD-10-CM | POA: Diagnosis not present

## 2023-12-02 DIAGNOSIS — Z87891 Personal history of nicotine dependence: Secondary | ICD-10-CM | POA: Diagnosis not present

## 2023-12-02 DIAGNOSIS — R1084 Generalized abdominal pain: Secondary | ICD-10-CM | POA: Diagnosis not present

## 2023-12-02 DIAGNOSIS — K7460 Unspecified cirrhosis of liver: Secondary | ICD-10-CM | POA: Diagnosis not present

## 2023-12-02 DIAGNOSIS — R162 Hepatomegaly with splenomegaly, not elsewhere classified: Secondary | ICD-10-CM | POA: Diagnosis not present

## 2023-12-02 DIAGNOSIS — R112 Nausea with vomiting, unspecified: Secondary | ICD-10-CM | POA: Diagnosis not present

## 2023-12-02 DIAGNOSIS — Z79899 Other long term (current) drug therapy: Secondary | ICD-10-CM | POA: Diagnosis not present

## 2023-12-17 DIAGNOSIS — Z8619 Personal history of other infectious and parasitic diseases: Secondary | ICD-10-CM | POA: Diagnosis not present

## 2023-12-17 DIAGNOSIS — R5383 Other fatigue: Secondary | ICD-10-CM | POA: Diagnosis not present

## 2023-12-17 DIAGNOSIS — F419 Anxiety disorder, unspecified: Secondary | ICD-10-CM | POA: Diagnosis not present

## 2023-12-17 DIAGNOSIS — D509 Iron deficiency anemia, unspecified: Secondary | ICD-10-CM | POA: Diagnosis not present

## 2023-12-17 DIAGNOSIS — F1090 Alcohol use, unspecified, uncomplicated: Secondary | ICD-10-CM | POA: Diagnosis not present

## 2023-12-17 DIAGNOSIS — K7460 Unspecified cirrhosis of liver: Secondary | ICD-10-CM | POA: Diagnosis not present

## 2023-12-20 DIAGNOSIS — I10 Essential (primary) hypertension: Secondary | ICD-10-CM | POA: Diagnosis not present

## 2023-12-20 DIAGNOSIS — E782 Mixed hyperlipidemia: Secondary | ICD-10-CM | POA: Diagnosis not present

## 2023-12-20 DIAGNOSIS — Z789 Other specified health status: Secondary | ICD-10-CM | POA: Diagnosis not present

## 2023-12-20 DIAGNOSIS — K7469 Other cirrhosis of liver: Secondary | ICD-10-CM | POA: Diagnosis not present

## 2023-12-20 DIAGNOSIS — Z7689 Persons encountering health services in other specified circumstances: Secondary | ICD-10-CM | POA: Diagnosis not present

## 2023-12-20 DIAGNOSIS — I480 Paroxysmal atrial fibrillation: Secondary | ICD-10-CM | POA: Diagnosis not present

## 2023-12-27 ENCOUNTER — Ambulatory Visit: Admitting: Cardiology

## 2024-02-11 DIAGNOSIS — K7460 Unspecified cirrhosis of liver: Secondary | ICD-10-CM | POA: Diagnosis not present
